# Patient Record
Sex: Male | Born: 1990 | Race: White | Hispanic: No | Marital: Single | Smoking: Never smoker
Health system: Southern US, Community
[De-identification: ages and names within clinical notes are randomized; demographics above are authoritative.]

## PROBLEM LIST (undated history)

## (undated) DIAGNOSIS — I10 Essential (primary) hypertension: Secondary | ICD-10-CM

## (undated) DIAGNOSIS — F909 Attention-deficit hyperactivity disorder, unspecified type: Secondary | ICD-10-CM

## (undated) DIAGNOSIS — F32A Depression, unspecified: Secondary | ICD-10-CM

## (undated) DIAGNOSIS — Z8249 Family history of ischemic heart disease and other diseases of the circulatory system: Secondary | ICD-10-CM

## (undated) DIAGNOSIS — F419 Anxiety disorder, unspecified: Secondary | ICD-10-CM

## (undated) HISTORY — PX: OTHER SURGICAL HISTORY: SHX169

## (undated) HISTORY — DX: Family history of ischemic heart disease and other diseases of the circulatory system: Z82.49

## (undated) HISTORY — DX: Essential (primary) hypertension: I10

---
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
## 2012-10-20 ENCOUNTER — Other Ambulatory Visit: Payer: Self-pay | Admitting: Cardiovascular Disease

## 2013-01-18 ENCOUNTER — Other Ambulatory Visit: Payer: Self-pay | Admitting: Cardiovascular Disease

## 2013-01-18 ENCOUNTER — Other Ambulatory Visit: Payer: Self-pay | Admitting: Internal Medicine

## 2013-03-22 ENCOUNTER — Other Ambulatory Visit: Payer: Self-pay | Admitting: Cardiovascular Disease

## 2020-02-14 ENCOUNTER — Emergency Department (HOSPITAL_COMMUNITY)
Admission: EM | Admit: 2020-02-14 | Discharge: 2020-02-14 | Disposition: A | Discharge status: Home / Self Care | Payer: 59 | Attending: Emergency Medicine | Admitting: Emergency Medicine

## 2020-02-14 ENCOUNTER — Other Ambulatory Visit: Payer: Self-pay

## 2020-02-14 ENCOUNTER — Ambulatory Visit (HOSPITAL_COMMUNITY)
Admission: EM | Admit: 2020-02-14 | Discharge: 2020-02-14 | Disposition: A | Discharge status: Home / Self Care | Payer: 59 | Attending: Registered Nurse | Admitting: Registered Nurse

## 2020-02-14 ENCOUNTER — Encounter (HOSPITAL_COMMUNITY): Payer: Self-pay | Admitting: Registered Nurse

## 2020-02-14 ENCOUNTER — Encounter (HOSPITAL_COMMUNITY): Payer: Self-pay

## 2020-02-14 DIAGNOSIS — R110 Nausea: Secondary | ICD-10-CM | POA: Diagnosis not present

## 2020-02-14 DIAGNOSIS — F411 Generalized anxiety disorder: Secondary | ICD-10-CM | POA: Diagnosis not present

## 2020-02-14 DIAGNOSIS — F410 Panic disorder [episodic paroxysmal anxiety] without agoraphobia: Secondary | ICD-10-CM | POA: Diagnosis present

## 2020-02-14 DIAGNOSIS — Z5321 Procedure and treatment not carried out due to patient leaving prior to being seen by health care provider: Secondary | ICD-10-CM | POA: Diagnosis not present

## 2020-02-14 DIAGNOSIS — F419 Anxiety disorder, unspecified: Secondary | ICD-10-CM | POA: Diagnosis not present

## 2020-02-14 DIAGNOSIS — R079 Chest pain, unspecified: Secondary | ICD-10-CM | POA: Insufficient documentation

## 2020-02-14 DIAGNOSIS — R0602 Shortness of breath: Secondary | ICD-10-CM | POA: Diagnosis not present

## 2020-02-14 DIAGNOSIS — Z638 Other specified problems related to primary support group: Secondary | ICD-10-CM | POA: Diagnosis not present

## 2020-02-14 HISTORY — DX: Depression, unspecified: F32.A

## 2020-02-14 HISTORY — DX: Anxiety disorder, unspecified: F41.9

## 2020-02-14 HISTORY — DX: Attention-deficit hyperactivity disorder, unspecified type: F90.9
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
## 2020-02-14 NOTE — ED Notes (Signed)
[address] 

## 2020-02-14 NOTE — ED Notes (Signed)
Discharge instructions reviewed, stated understanding. Clarification for meds made. Escorted to the lobby where Pt's mom was waiting. Personal belongings returned to Pt.

## 2020-02-14 NOTE — Discharge Instructions (Addendum)
Follow up with current psychiatrist in [place].  Call to see if will give a prescription for an as needed anxiety medication.  Once back in [place] follow in person
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
## 2021-10-10 ENCOUNTER — Other Ambulatory Visit (HOSPITAL_COMMUNITY): Payer: 59 | Attending: Psychiatry | Admitting: Psychiatry

## 2021-10-10 DIAGNOSIS — F319 Bipolar disorder, unspecified: Secondary | ICD-10-CM | POA: Insufficient documentation

## 2021-10-10 DIAGNOSIS — Z79899 Other long term (current) drug therapy: Secondary | ICD-10-CM | POA: Insufficient documentation

## 2021-10-10 DIAGNOSIS — F4323 Adjustment disorder with mixed anxiety and depressed mood: Secondary | ICD-10-CM | POA: Insufficient documentation

## 2021-10-10 NOTE — Progress Notes (Addendum)
Virtual Visit via Video Note  I connected with Jerry [name] on @TODAY @ at  9:30 AM EDT by a video enabled telemedicine application and verified that I am speaking with the correct person using two identifiers.  Location: Patient: at home Provider: at office   I discussed the limitations of evaluation and management by telemedicine and the availability of in person appointments. The patient expressed understanding and agreed to proceed.  I discussed the assessment and treatment plan with the patient. The patient was provided an opportunity to ask questions and all were answered. The patient agreed with the plan and demonstrated an understanding of the instructions.   The patient was advised to call back or seek an in-person evaluation if the symptoms worsen or if the condition fails to improve as anticipated.  I provided 90 minutes of non-face-to-face time during this encounter.   [name], M.Ed,CNA   Comprehensive Clinical Assessment (CCA) Note  10/10/2021 Jerry [name] [id number]  Chief Complaint:  Chief Complaint  Patient presents with   Anxiety   Depression   Adjustment Disorder   Visit Diagnosis: F 31.12    CCA Screening, Triage and Referral (STR)  Patient Reported Information How did you hear about [place]? Other (Comment)  Referral name: Family referred pt  Referral phone number: No data recorded  Whom do you see for routine medical problems? I don't have a doctor  Practice/Facility Name: No data recorded Practice/Facility Phone Number: No data recorded Name of Contact: No data recorded Contact Number: No data recorded Contact Fax Number: No data recorded Prescriber Name: No data recorded Prescriber Address (if known): No data recorded  What Is the Reason for Your Visit/Call Today? Was in [place]; had to leave the PHP there, was just stepped down to MH-IOP; but had to leave and come back to [place] d/t job.  How Long Has This Been Causing You Problems? > than 6  months  What Do You Feel Would Help You the Most Today? Stress Management; Treatment for Depression or other mood problem   Have You Recently Been in Any Inpatient Treatment (Hospital/Detox/Crisis Center/28-Day Program)? No  Name/Location of Program/Hospital:No data recorded How Long Were You There? No data recorded When Were You Discharged? No data recorded  Have You Ever Received Services From [facility] Before? Yes  Who Do You [facility] at [facility]? Oct. [facility]   Have You Recently Had Any Thoughts About Hurting Yourself? No  Are You Planning to Commit Suicide/Harm Yourself At This time? No   Have you Recently Had Thoughts About [place]? No  Explanation: No data recorded  Have You Used Any Alcohol or Drugs in the Past 24 Hours? Yes  How Long Ago Did You Use Drugs or Alcohol? No data recorded What Did You Use and How Much? THC; "a vape pen"   Do You Currently Have a Therapist/Psychiatrist? No  Name of Therapist/Psychiatrist: No data recorded  Have You Been Recently Discharged From Any Office Practice or Programs? Yes  Explanation of Discharge From Practice/Program: cc: above     CCA Screening Triage Referral Assessment Type of Contact: No data recorded Is this Initial or Reassessment? No data recorded Date Telepsych consult ordered in [facility]:  No data recorded Time Telepsych consult ordered in [facility]:  No data recorded  Patient Reported Information Reviewed? No data recorded Patient Left Without Being Seen? No data recorded Reason for Not Completing Assessment: No data recorded  Collateral Involvement: No data recorded  Does Patient Have a Court Appointed  Legal Guardian? No data recorded Name and Contact of Legal Guardian: No data recorded If Minor and Not Living with Parent(s), Who has Custody? No data recorded Is CPS involved or ever been involved? Never  Is APS involved or ever been involved? Never   Patient Determined To Be At Risk for Harm To Self  or Others Based on Review of Patient Reported Information or Presenting Complaint? No  Method: No data recorded Availability of Means: No data recorded Intent: No data recorded Notification Required: No data recorded Additional Information for Danger to Others Potential: No data recorded Additional Comments for Danger to Others Potential: No data recorded Are There Guns or Other Weapons in Your Home? No data recorded Types of Guns/Weapons: No data recorded Are These Weapons Safely Secured?                            No data recorded Who Could Verify You Are Able To Have These Secured: No data recorded Do You Have any Outstanding Charges, Pending Court Dates, Parole/Probation? No data recorded Contacted To Inform of Risk of Harm To Self or Others: No data recorded  Location of Assessment: Other (comment)   Does Patient Present under Involuntary Commitment? No  IVC Papers Initial File Date: No data recorded  [place] of Residence: [name]   Patient Currently Receiving the Following Services: No data recorded  Determination of Need: Routine (7 days)   Options For Referral: Intensive Outpatient Therapy     CCA Biopsychosocial Intake/Chief Complaint:  This is a 31 yr old, single, Caucasian male who was a self referral.  According to pt, he was recently attending a PHP at [facility] in [place] (07-23-21 thru 09-09-21; d/t his Bipolar episode.  States the program had just stepped him down to MH-IOP, but he received a call from his employer that he needed to return back to work.  Pt states his treatment was interrupted to come home for [facility], but they still are going to let him go on 10-23-21. Stressors:  1) Job:  Exploring job options.  2) Finances:  Pt states he is in a lot of debt.  "I constantly worry about my finances.  3) Housing:  Pt is temporarily residing with his mother.  He states they argue all the time.  Pt denies any previous psych admissions or suicide attempts or gestures.  Has a new  patient appt coming up with [facility] (psychiatrist on 10-23-21) and therapist on 11-28-21.  Also new pt appt with a PCP on 10-25-21.  Family hx:  Maternal Aunt (Bipolar); Mom (Depressed) and Sister (Anxiety).  Current Symptoms/Problems: poor concentration, irritability, anger, poor sleep, mood swings, sad, ruminating, racing thoughts, anxiety, anhedonia, restlessness, decreased appetite, crying spells, increased energy   Patient Reported Schizophrenia/Schizoaffective Diagnosis in Past: No   Strengths: "I am empathetic, I work really hard at what I do."  Preferences: "I need to work on being nice to people and to not lie.  I need to work on my finances.  I need to work on time management."  Abilities: No data recorded  Type of Services Patient Feels are Needed: MH-IOP   Initial Clinical Notes/Concerns: Patients medications doesn't seem to be working for him.  Pt appears to be in a manic episode still.   Mental Health Symptoms Depression:   Change in energy/activity; Difficulty Concentrating; Increase/decrease in appetite; Fatigue; Irritability; Sleep (too much or little); Tearfulness; Worthlessness   Duration of Depressive symptoms:  Greater than two weeks   Mania:   Change in energy/activity; Irritability; Racing thoughts; Increased Energy; Euphoria; Overconfidence   Anxiety:    Restlessness   Psychosis:   None   Duration of Psychotic symptoms: No data recorded  Trauma:   N/A   Obsessions:   N/A   Compulsions:   N/A   Inattention:   N/A   Hyperactivity/Impulsivity:   N/A   Oppositional/Defiant Behaviors:   N/A   Emotional Irregularity:   N/A   Other Mood/Personality Symptoms:  No data recorded   Mental Status Exam Appearance and self-care  Stature:   Average   Weight:   Overweight   Clothing:   Casual   Grooming:   Normal   Cosmetic use:   None   Posture/gait:   Normal   Motor activity:   Not Remarkable   Sensorium  Attention:    Normal   Concentration:   Variable   Orientation:   X5   Recall/memory:   Normal   Affect and Mood  Affect:   Appropriate   Mood:   Euphoric   Relating  Eye contact:   Normal   Facial expression:   Responsive   Attitude toward examiner:   Cooperative   Thought and Language  Speech flow:  Normal   Thought content:   Appropriate to Mood and Circumstances   Preoccupation:   None   Hallucinations:   None   Organization:  No data recorded  [employer] of Knowledge:   Average   Intelligence:   Average   Abstraction:   Normal   Judgement:   Impaired   Reality Testing:   Adequate   Insight:   Good   Decision Making:   Vacilates   Social Functioning  Social Maturity:   Impulsive   Social Judgement:   Normal   Stress  Stressors:   Museum/gallery curator; Work; Family conflict; Grief/losses; Housing; Illness; Legal; Relationship   Coping Ability:   Overwhelmed; Exhausted   Skill Deficits:   Decision making; Communication   Supports:   Family; Friends/Service system; Church     Religion: Religion/Spirituality Are You A Religious Person?: Yes What is Your Religious Affiliation?: Methodist  Leisure/Recreation: Leisure / Recreation Do You Have Hobbies?: Yes Leisure and Hobbies: shopping, decorating home, Artist, music, food  Exercise/Diet: Exercise/Diet Do You Exercise?: No Have You Gained or Lost A Significant Amount of Weight in the Past Six Months?: No Do You Follow a Special Diet?: No Do You Have Any Trouble Sleeping?: Yes Explanation of Sleeping Difficulties: "I don't want to go to sleep, b/c my dreams are too depressing."   CCA Employment/Education Employment/Work Situation: Employment / Work Situation Employment Situation: Employed Where is Patient Currently Employed?: States he will be unemployed on 10-23-21 from [place] has Patient Been Employed?: Since Aug. 2021 Are You Satisfied With Your Job?:  No Do You Work More Than One Job?: No Work Stressors: States they will be firing him on 10-23-21.  He said it's too much pressure.  He's a Electrical engineer. Patient's Job has Been Impacted by Current Illness: Yes Describe how Patient's Job has Been Impacted: Had to take a leave of absence d/t extreme panic attacks. What is the Longest Time Patient has Held a Job?: 4 1/2 yrs Where was the Patient Employed at that Time?: [place] in [place] Has Patient ever Been in the [employer]?: No  Education: Education Is Patient Currently Attending School?: No Did Teacher, adult education From [employer]?: Yes Did  You Attend College?: Yes What Type of College Degree Do you Have?: BS Did [place]?: No What Was Your Major?: Design Did You Have An Individualized Education Program (IIEP): No Did You Have Any Difficulty At School?: No Patient's Education Has Been Impacted by Current Illness: No   CCA Family/Childhood History Family and Relationship History: Family history Marital status: Single Are you sexually active?: Yes What is your sexual orientation?: "gay" Has your sexual activity been affected by drugs, alcohol, medication, or emotional stress?: n/a Does patient have children?: No  Childhood History:  Childhood History By whom was/is the patient raised?: Mother Additional childhood history information: Born in [place]; raised in [place], [place].  At age 78 pt's father passed; raised by mother.  Grew up with a single parent (mom).  Mother was very depressed and "kind of absent."  Was a Proofreader.  Denies any abuse. Patient's description of current relationship with people who raised him/her: Constantly agruing with mother. Does patient have siblings?: Yes Number of Siblings: 1 Description of patient's current relationship with siblings: Older sister lives in [place].  Pt is close "enough" to sister. Did patient suffer any verbal/emotional/physical/sexual abuse as a child?: No Did patient suffer from severe  childhood neglect?: No Has patient ever been sexually abused/assaulted/raped as an adolescent or adult?: No Was the patient ever a victim of a crime or a disaster?: No Witnessed domestic violence?: No Has patient been affected by domestic violence as an adult?: No  Child/Adolescent Assessment:     CCA Substance Use Alcohol/Drug Use: Alcohol / Drug Use Pain Medications: cc: MAR Prescriptions: Depakote 250mg  daily, Depakote ER 500 mg, Buspar 15 mg BID, Gabapentin 300 mg BID, Lexapro 20 mg daily, Atarx 50 mg hs, Melatonin 5 mg hs Over the Counter: cc: MAR History of alcohol / drug use?: Yes Substance #1 Name of Substance 1: ETOH 1 - Age of First Use: 20 1 - Amount (size/oz): Wine/Beer (3-5 drinks at a time)  "It varies" 1 - Frequency: 2-3 nights a week 1 - Duration: last yr and a half 1 - Last Use / Amount: 10-08-21 (two glasses of wine at dinner) 1 - Method of Aquiring: store 1- Route of Use: drink Substance #2 Name of Substance 2: THC 2 - Age of First Use: 27 2 - Amount (size/oz): ~ three bowls 2 - Frequency: daily 2 - Duration: 1 1/2 yrs 2 - Last Use / Amount: 10-09-21; "few puffs" 2 - Method of Aquiring: brought from [place] 2 - Route of Substance Use: smoke                     ASAM's:  Six Dimensions of Multidimensional Assessment  Dimension 1:  Acute Intoxication and/or Withdrawal Potential:      Dimension 2:  Biomedical Conditions and Complications:      Dimension 3:  Emotional, Behavioral, or Cognitive Conditions and Complications:     Dimension 4:  Readiness to Change:     Dimension 5:  Relapse, Continued use, or Continued Problem Potential:     Dimension 6:  Recovery/Living Environment:     ASAM Severity Score:    ASAM Recommended Level of Treatment:     Substance use Disorder (SUD)    Recommendations for Services/Supports/Treatments: Recommendations for Services/Supports/Treatments Recommendations For Services/Supports/Treatments: IOP (Intensive  Outpatient Program)  DSM5 Diagnoses: Patient Active Problem List   Diagnosis Date Noted   Panic attack 02/14/2020   GAD (generalized anxiety disorder) 02/14/2020   Family discord  02/14/2020   DYSPNEA 10/13/2009   ESSENTIAL HYPERTENSION, BENIGN 06/27/2009   HYPERTENSION, UNSPECIFIED 06/16/2009   CHEST PAIN-UNSPECIFIED 06/16/2009    Patient Centered Plan: Patient is on the following Treatment Plan(s):  Anxiety, Depression, and Impulse Control Oriented pt. Pt was advised of ROI must be obtained prior to any records release in order to collaborate his care with an outside provider.  Pt was advised if he has not already done so to contact the front desk to sign all necessary forms in order for MH-IOP to release info re: his care.  Consent:  Pt gives verbal consent for tx and assignment of benefits for services provided during this telehealth group process.  Pt expressed understanding and agreed to proceed. Collaboration of care:  Collaborate with Ricky Ala, NP AEB and Shade Flood, LCSW AEB.  Will collaborate with pt's new therapist for individual counseling and a psychiatrist for medication management after pt meets with them. Pt will improve his mood as evidenced by being happy again, managing his mood and coping with daily stressors for 5 out of 7 days for 60 days   Referrals to Alternative Service(s): Referred to Alternative Service(s):   Place:   Date:   Time:    Referred to Alternative Service(s):   Place:   Date:   Time:    Referred to Alternative Service(s):   Place:   Date:   Time:    Referred to Alternative Service(s):   Place:   Date:   Time:      @BHCOLLABOFCARE @  [place], RITA, M.Ed,CNA

## 2021-10-11 ENCOUNTER — Other Ambulatory Visit (HOSPITAL_COMMUNITY): Payer: 59 | Admitting: Psychiatry

## 2021-10-11 DIAGNOSIS — F4323 Adjustment disorder with mixed anxiety and depressed mood: Secondary | ICD-10-CM | POA: Diagnosis present

## 2021-10-11 DIAGNOSIS — Z79899 Other long term (current) drug therapy: Secondary | ICD-10-CM | POA: Diagnosis not present

## 2021-10-11 DIAGNOSIS — F319 Bipolar disorder, unspecified: Secondary | ICD-10-CM | POA: Diagnosis not present
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
## 2021-10-11 NOTE — Plan of Care (Signed)
Patient is an active participant in his treatment plan.

## 2021-10-12 ENCOUNTER — Other Ambulatory Visit (HOSPITAL_COMMUNITY): Payer: 59 | Admitting: Licensed Clinical Social Worker

## 2021-10-12 DIAGNOSIS — F4323 Adjustment disorder with mixed anxiety and depressed mood: Secondary | ICD-10-CM

## 2021-10-12 NOTE — Progress Notes (Signed)
Virtual Visit via Video Note   I connected with Jerry [name] on 10/12/21 at  9:00 AM EDT by a video enabled telemedicine application and verified that I am speaking with the correct person using two identifiers.   At orientation to the IOP program, Case Manager discussed the limitations of evaluation and management by telemedicine and the availability of in person appointments. The patient expressed understanding and agreed to proceed with virtual visits throughout the duration of the program.   Location:  Patient: [facility] Home Provider: [facility]   History of Present Illness: Adjustment Disorder with Mixed Anxiety and Depressed Mood   Observations/Objective: Check In: Case Manager checked in with all participants to review discharge dates, insurance authorizations, work-related documents and needs from the treatment team regarding medications. Jerry [name] stated needs and engaged in discussion.    Initial Therapeutic Activity: Counselor facilitated a check-in with Jerry [name] to assess for safety, sobriety and medication compliance.  Counselor also inquired about Jerry [name]'s current emotional ratings, as well as any significant changes in thoughts, feelings or behavior since previous check in.  Jerry [name] presented for session on time and was alert, oriented x5, with no evidence or self-report of active SI/HI or A/V H.  Jerry [name] reported compliance with medication and denied use of alcohol.  He reported that he is still using marijuana daily, but attempting to cut back, stating I want to wean myself off eventually".  Jerry [name] reported scores of 8/10 for depression, 9/10 for anxiety, and 5/10 for irritability.  Keylor denied any recent outbursts or panic attacks.  Jerry [name] reported that a recent struggle was feeling down after group yesterday, but a success was taking a nap, and then going to dinner with an old friend.  Jerry [name] reported that his goal this weekend is to work on his resume so he can begin putting in applications  again.       Second Therapeutic Activity:  Counselor offered to teach group members an ACT relaxation technique today to aid in managing difficult thoughts, feelings, urges, and sensations.  Counselor guided members through process of getting comfortable, achieving relaxing breathing rhythm, and then maintaining this throughout activity.  Counselor invited members to imagine a gently flowing stream in their mind with leaves floating upon it, and when any thoughts, feelings, urges, or sensations arose, good or bad, they were instructed to visualize placing them on these passing leaves over course of 10 minutes practice.  Intervention was ineffective, as evidenced by Jerry [name] attempting exercise, but reporting that he finds meditation style activities frustrating because its hard to focus.  Bear reported that he has tried similar activities in the past with little success, but remains motivated to keep an open mind and try other techniques during [facility] to improve coping skills.    Assessment and Plan: Counselor recommends that [place] remain in IOP treatment to better manage mental health symptoms, ensure stability and pursue completion of treatment plan goals. Counselor recommends adherence to crisis/safety plan, taking medications as prescribed, and following up with medical professionals if any issues arise.   Follow Up Instructions: Counselor will send Webex link for next session. Charon was advised to call back or seek an in-person evaluation if the symptoms worsen or if the condition fails to improve as anticipated.   Collaboration of Care:   Medication Management AEB Jerry Jacks, NP  Case Manager AEB Jerry Modena, CNA     Patient/Guardian was advised Release of Information must be obtained prior to any record release in order to collaborate their care with an outside provider. Patient/Guardian was advised if they have not already done so to contact the  registration department to sign all necessary forms in order for [place] to release information regarding their care.    Consent: Patient/Guardian gives verbal consent for treatment and assignment of benefits for services provided during this visit. Patient/Guardian expressed understanding and agreed to proceed.   I provided 180 minutes of non-face-to-face time during this encounter.   Jerry Stain, LCSW, LCAS 10/12/21

## 2021-10-15 ENCOUNTER — Other Ambulatory Visit (HOSPITAL_COMMUNITY): Payer: 59 | Admitting: Licensed Clinical Social Worker

## 2021-10-15 DIAGNOSIS — F4323 Adjustment disorder with mixed anxiety and depressed mood: Secondary | ICD-10-CM | POA: Diagnosis not present

## 2021-10-15 NOTE — Progress Notes (Signed)
Virtual Visit via Video Note   I connected with [name] on 10/15/21 at  9:00 AM EDT by a video enabled telemedicine application and verified that I am speaking with the correct person using two identifiers.   At orientation to the IOP program, Case Manager discussed the limitations of evaluation and management by telemedicine and the availability of in person appointments. The patient expressed understanding and agreed to proceed with virtual visits throughout the duration of the program.   Location:  Patient: [facility] Home [facility]: [facility]   History of Present Illness: Adjustment Disorder with mixed Anxiety and Depressed Mood    Observations/Objective: Check In: Case Manager checked in with all participants to review discharge dates, insurance authorizations, work-related documents and needs from the treatment team regarding medications. Kaan stated needs and engaged in discussion.    Initial Therapeutic Activity: Counselor facilitated a check-in with Phill to assess for safety, sobriety and medication compliance.  Counselor also inquired about Hesston's current emotional ratings, as well as any significant changes in thoughts, feelings or behavior since previous check in.  Violet presented for session on time and was alert, oriented x5, with no evidence or self-report of active SI/HI or A/V H.  Westly reported compliance with medication.  He reported that he did drink some wine and smoke marijuana over the weekend, although he denied abuse.  Socorro reported scores of 8/10 for depression, 5/10 for anxiety, and 10/10 for anger/irritability.  Bulmaro denied any recent panic attacks.  Elvin reported that a recent success was going out of town to visit a friend for her birthday, although this reminded him of how lonely he has been since moving.  Kadan reported that a recent struggle was having an outburst over the weekend after visiting his father's grave, stating "Me and mom my got into an  argument, but we talked it out".  Sherron reported that his goal today is to prepare for a trip out of town for a wedding this weekend.      Second Therapeutic Activity: Counselor introduced topic of self-care today.  Counselor explained how this can be defined as the things one does to maintain good health and improve well-being.  Counselor provided members with a self-care assessment form to complete.  This handout featured various sub-categories of self-care, including physical, psychological/emotional, social, spiritual, and professional.  Members were asked to rank their engagement in the activities listed for each dimension on a scale of 1-3, with 1 indicating 'Poor', 2 indicating 'Ok', and 3 indicating 'Well'.  Counselor invited members to share results of their assessment, and inquired about which areas of self-care they are doing well in, as well as areas that require attention, and how they plan to begin addressing this during treatment.  Intervention was effective, as evidenced by [name] successfully completing first section (physical) of assessment and actively engaging in discussion on subject, reporting that he is excelling in areas such as going to preventative medical appointments, wearing clothes that make him feel good about himself, and eating regularly, but would benefit from focusing more on areas such as eating healthy foods, taking care of personal hygiene, exercising, participating in fun activities, and getting enough sleep.  Iban reported that he would work to improve self-care deficits by implementing sleep hygiene techniques to improve rest, getting outside of the home to socialize more often, making healthier food choices, finding a new home gym and taking exercise classes when motivation returns.    Assessment and Plan: Counselor recommends that  [place] remain in IOP treatment to better manage mental health symptoms, ensure stability and pursue completion of treatment plan goals.  Counselor recommends adherence to crisis/safety plan, taking medications as prescribed, and following up with medical professionals if any issues arise.   Follow Up Instructions: Counselor will send Webex link for next session. Kellan was advised to call back or seek an in-person evaluation if the symptoms worsen or if the condition fails to improve as anticipated.   Collaboration of Care:   Medication Management AEB Hillery Jacks, NP                                           Case Manager AEB Jeri Modena, CNA     Patient/Guardian was advised Release of Information must be obtained prior to any record release in order to collaborate their care with an outside provider. Patient/Guardian was advised if they have not already done so to contact the registration department to sign all necessary forms in order for [place] to release information regarding their care.    Consent: Patient/Guardian gives verbal consent for treatment and assignment of benefits for services provided during this visit. Patient/Guardian expressed understanding and agreed to proceed.   I provided 180 minutes of non-face-to-face time during this encounter.   [name], LCSW, LCAS 10/15/21

## 2021-10-16 ENCOUNTER — Other Ambulatory Visit (HOSPITAL_COMMUNITY): Payer: 59 | Admitting: Licensed Clinical Social Worker

## 2021-10-16 DIAGNOSIS — F4323 Adjustment disorder with mixed anxiety and depressed mood: Secondary | ICD-10-CM

## 2021-10-16 NOTE — Progress Notes (Signed)
Virtual Visit via Video Note   I connected with Jerry [name] on 10/16/21 at  9:00 AM EDT by a video enabled telemedicine application and verified that I am speaking with the correct person using two identifiers.   At orientation to the IOP program, Case Manager discussed the limitations of evaluation and management by telemedicine and the availability of in person appointments. The patient expressed understanding and agreed to proceed with virtual visits throughout the duration of the program.   Location:  Patient: Patient Home Provider: [facility]   History of Present Illness: Adjustment Disorder with Mixed Anxiety and Depressed Mood    Observations/Objective: Check In: Case Manager checked in with all participants to review discharge dates, insurance authorizations, work-related documents and needs from the treatment team regarding medications. Jerry [name] stated needs and engaged in discussion.    Initial Therapeutic Activity: Counselor facilitated a check-in with Jerry [name] to assess for safety, sobriety and medication compliance.  Counselor also inquired about Jerry [name]'s current emotional ratings, as well as any significant changes in thoughts, feelings or behavior since previous check in.  Jerry [name] presented for session on time and was alert, oriented x5, with no evidence or self-report of active SI/HI or A/V H.  Jerry [name] [name] compliance with medication and denied use of alcohol or illicit substances.  Jerry [name] [name] scores of 6/10 for depression, 6/10 for anxiety, and 4/10 for anger/irritability.  Jerry [name] denied any recent outbursts or panic attacks.  Jerry [name] [name] that a recent success was having dinner, washing some dishes, and watching TV with his mother yesterday evening.  Jerry [name] [name] that a recent struggle was having to speak with his sister about the upcoming wedding, stating "We don't always see eye to eye".  Jerry [name] [name] that his goal today is to take a shower and brush his teeth, stating "I  want to do the bare minimum".        Second Therapeutic Activity: Counselor introduced Jerry [name], Jerry [name] to provide psychoeducation on topic of Grief and Loss with members today.  Jerry [name] began discussion by checking in with the group about their baseline mood today, general thoughts on what grief means to them and how it has affected them personally in the past.  Jerry [name] provided information on how the process of grief/loss can differ depending upon one's unique culture, and categories of loss one could experience (i.e. loss of a person, animal, relationship, job, identity, etc).  Jerry [name] encouraged members to be mindful of how pervasive loss can be, and how to recognize signs which could indicate that this is having an impact on one's overall mental health and wellbeing.  Intervention was effective, as evidenced by Jerry [name] participating in discussion with speaker on the subject, reporting that loss has had a profound effect on his life due to the early passing of his father when he was 56 years old.  Jerry [name] that as a result of this, he anticipates loss and tries to protect himself from being surprised when something goes wrong.  Jerry [name] that to cope with loss, he tries to distract himself by spending time with quality supports or engaging in self-care activities like listening to an Engineer, production.    Third Therapeutic Activity: Counselor discussed topic of grounding techniques with members to improve coping ability.  Counselor invited members to practice the 5-4-3-2-1 grounding technique today in session to help temporarily distract them from troubling thoughts and/or feelings when needed.  Counselor guided members through process of scanning their environment and identifying 5  things they could see, 4 things they could touch, 3 things they could hear, 2 they could smell, and 1 thing they could taste.  Intervention was effective, as evidenced by Jerry [name] participating in activity  successfully and reporting that this exercise helped him identify calming scents which could aid in relaxation when feeling distressed, such as the smell of honey suckle since this reminds him of his childhood home.     Assessment and Plan: Counselor recommends that [name] remain in IOP treatment to better manage mental health symptoms, ensure stability and pursue completion of treatment plan goals. Counselor recommends adherence to crisis/safety plan, taking medications as prescribed, and following up with medical professionals if any issues arise.   Follow Up Instructions: Counselor will send Webex link for next session. [name] was advised to call back or seek an in-person evaluation if the symptoms worsen or if the condition fails to improve as anticipated.   Collaboration of Care:   Medication Management AEB Hillery Jacks, NP                                           Case Manager AEB Jeri Modena, CNA     Patient/Guardian was advised Release of Information must be obtained prior to any record release in order to collaborate their care with an outside provider. Patient/Guardian was advised if they have not already done so to contact the registration department to sign all necessary forms in order for [place] to release information regarding their care.    Consent: Patient/Guardian gives verbal consent for treatment and assignment of benefits for services provided during this visit. Patient/Guardian expressed understanding and agreed to proceed.   I provided 180 minutes of non-face-to-face time during this encounter.   [name], LCSW, LCAS 10/16/21

## 2021-10-17 ENCOUNTER — Encounter (HOSPITAL_COMMUNITY): Payer: Self-pay | Admitting: Family

## 2021-10-17 ENCOUNTER — Other Ambulatory Visit (HOSPITAL_COMMUNITY): Payer: 59 | Admitting: Family

## 2021-10-17 DIAGNOSIS — F4323 Adjustment disorder with mixed anxiety and depressed mood: Secondary | ICD-10-CM

## 2021-10-17 NOTE — Progress Notes (Signed)
Virtual Visit via Video Note   I connected with Jerry [name] on 10/17/21 at  9:00 AM EDT by a video enabled telemedicine application and verified that I am speaking with the correct person using two identifiers.   At orientation to the IOP program, Case Manager discussed the limitations of evaluation and management by telemedicine and the availability of in person appointments. The patient expressed understanding and agreed to proceed with virtual visits throughout the duration of the program.   Location:  Patient: [facility] Home Provider: [facility]   History of Present Illness: Adjustment Disorder with Mixed Anxiety and Depressed Mood    Observations/Objective: Check In: Case Manager checked in with all participants to review discharge dates, insurance authorizations, work-related documents and needs from the treatment team regarding medications. Jerry [name] stated needs and engaged in discussion.    Initial Therapeutic Activity: Counselor facilitated a check-in with Jerry [name] to assess for safety, sobriety and medication compliance.  Counselor also inquired about Jerry [name]'s current emotional ratings, as well as any significant changes in thoughts, feelings or behavior since previous check in.  Jerry [name] presented for session on time and was alert, oriented x5, with no evidence or self-report of active SI/HI or A/V H.  Jerry [name] reported compliance with medication and denied use of alcohol.  Jerry [name] disclosed that he did use THC yesterday.  Jerry [name] reported scores of 5/10 for depression, 4/10 for anxiety, and 0/10 for anger/irritability.  Jerry [name] denied any recent outbursts or panic attacks.  Jerry [name] reported that a recent success was preventing himself from getting too upset about his old job.  Jerry [name] reported that a recent struggle was having an argument with his mother yesterday, stating "It wasn't as bad as it could have been, since we are trying to avoid fighting".  Jerry [name] reported that his goal today is to do some work on  his computer for 2 hours, stating "I need to get some photos and work off my portfolio".     Second Therapeutic Activity: Counselor introduced Jerry [name], Jerry [name], to provide psychoeducation on topic of nutrition with members today.  Jerry [name] virtually shared a comprehensive PowerPoint presentation to guide discussion, which featured the various components of healthy living that influence one's wellbeing, including practicing mindfulness, staying physically active, calm in mood, well-rested, and more.  Jerry [name] explained how good nutrition reinforces positive physical and mental health, and shared a video which explained how food intake affects brain functioning in particular.  Jerry [name] provided advice on how to adjust diet in order to promote wellbeing during course of treatment, including achieving balanced daily intake along with regular exercise.  Jerry [name] offered the 'plate method' as a tool for proper distribution of protein, grains, starches, vegetables, fruit, and low calorie drink, in addition to concept of 'mindful eating', and covering current Dietary Guidelines for Americans for more tips.  Jerry [name] inquired about changes members would like to make to their nutrition in order to increase overall wellbeing based upon information shared today, and time was allowed to ask any questions they might have of Jerry [name] regarding her specialty.  Intervention was effective, as evidenced by Jerry [name] participating in discussion with speaker on the subject, reporting that he used to be more active and maintain personal hygiene, but onset of depression greatly impeded his motivation to maintain good daily habits.  Jerry [name] also reported that addressing his diet would be beneficial during treatment, stating "I'm trying to change my relationship with food.  It got really bad over the past year and I'll either binge  eat way more than I need to, or barely eat at all".  Jerry [name] was receptive to feedback from nutritionist on how to begin  addressing current dietary issues, and improve day to day hygiene.  Jerry [name] stated "This showed me that a varied diet is important because there are a lot of nutrients the brain requires to be healthy".  Jerry [name] also participated in a voluntary stretching exercise in order to improve physical activity today.       Assessment and Plan: Counselor recommends that Jerry [name] remain in IOP treatment to better manage mental health symptoms, ensure stability and pursue completion of treatment plan goals. Counselor recommends adherence to crisis/safety plan, taking medications as prescribed, and following up with medical professionals if any issues arise.   Follow Up Instructions: Counselor will send Webex link for next session. Jerry [name] was advised to call back or seek an in-person evaluation if the symptoms worsen or if the condition fails to improve as anticipated.   Collaboration of Care:   Medication Management AEB Jerry Jacks, NP                                           Case Manager AEB Jerry Modena, CNA     Patient/Guardian was advised Release of Information must be obtained prior to any record release in order to collaborate their care with an outside provider. Patient/Guardian was advised if they have not already done so to contact the registration department to sign all necessary forms in order for [place] to release information regarding their care.    Consent: Patient/Guardian gives verbal consent for treatment and assignment of benefits for services provided during this visit. Patient/Guardian expressed understanding and agreed to proceed.   I provided 180 minutes of non-face-to-face time during this encounter.   Jerry Stain, LCSW, LCAS 10/17/21

## 2021-10-17 NOTE — Progress Notes (Cosign Needed Addendum)
Virtual Visit via Video Note  I connected with Jerry [name] on 10/17/21 at  9:00 AM EDT by a video enabled telemedicine application and verified that I am speaking with the correct person using two identifiers.  Location: Patient: Home Provider: Office   I discussed the limitations of evaluation and management by telemedicine and the availability of in person appointments. The patient expressed understanding and agreed to proceed.   I discussed the assessment and treatment plan with the patient. The patient was provided an opportunity to ask questions and all were answered. The patient agreed with the plan and demonstrated an understanding of the instructions.   The patient was advised to call back or seek an in-person evaluation if the symptoms worsen or if the condition fails to improve as anticipated.  I provided 15 minutes of non-face-to-face time during this encounter.   Oneta Rack, NP    Psychiatric Initial Adult Assessment   Patient Identification: Jerry [name] MRN:  [id number] Date of Evaluation:  10/17/2021 Referral Source: Partial hospitalization programming in [place] Chief Complaint: "I am feeling miserable"  Visit Diagnosis:    ICD-10-CM   1. Adjustment disorder with mixed anxiety and depressed mood  F43.23       History of Present Illness:  Jerry [name] is a 31 year old Caucasian male that was referred by previous partial hospitalization program in [place].  He reports a history with bipolar disorder, major depressive disorder and anxiety/panic disorder.  Jerry [name] reports he is currently prescribed Depakote, Lexapro, gabapentin, BuSpar and hydroxyzine.  States he has previously taken Lamictal, Seroquel and Topamax in the past without success.  States due to his heart condition he has trouble starting and stopping new medications.  He reports a follow-up appointment with a new psychiatrist at [facility] for medication management early next  week.  Jerry [name] reports experiencing frequent and worsening panic/anxiety attacks while in [place] where he was residing due to employment.  States after he suffered the worst panic attack he is ever experienced he was laid off from his job and forced to move back to [place].  He reports daily use of marijuana.  States occasional use with alcohol every few days.  He reports feeling measurable.  Denying suicidal or homicidal ideations.  Denies auditory or visual hallucinations.  Jerry [name] reports history with chronical physical pain.  States he is prescribed lorazepam and currently has 15 tablets available.  Denied previous inpatient admissions.  Patient to start intensive outpatient programming on 10/16/2021  Associated Signs/Symptoms: Depression Symptoms:  depressed mood, feelings of worthlessness/guilt, difficulty concentrating, anxiety, (Hypo) Manic Symptoms:  Distractibility, Impulsivity, Irritable Mood, Anxiety Symptoms:  Excessive Worry, Psychotic Symptoms:  Hallucinations: None PTSD Symptoms: NA  Past Psychiatric History: Reports that he has been followed by therapy and psychiatry in the past.  Is seeking to establish care with [facility] for medication management.  Denied previous suicide attempts and/or inpatient admissions.  Previous Psychotropic Medications: Yes   Substance Abuse History in the last 12 months:  Yes.    Consequences of Substance Abuse: NA  Past Medical History:  Past Medical History:  Diagnosis Date   ADHD    Anxiety    Depression    HTN (hypertension)     Past Surgical History:  Procedure Laterality Date   Wisdom teeth removal      Family Psychiatric History:   Family History:  Family History  Problem Relation Age of Onset   Heart attack Father 71    Social History:  Social History   Socioeconomic History   Marital status: Single    Spouse name: Not on file   Number of children: Not on file   Years of education: Not  on file   Highest education level: Not on file  Occupational History   Occupation: Single  Tobacco Use   Smoking status: Every Day   Smokeless tobacco: Current  Substance and Sexual Activity   Alcohol use: Yes   Drug use: Yes    Types: Cocaine, Marijuana   Sexual activity: Not on file  Other Topics Concern   Not on file  Social History Narrative   Not on file   Social Determinants of Health   Financial Resource Strain: Not on file  Food Insecurity: Not on file  Transportation Needs: Not on file  Physical Activity: Not on file  Stress: Not on file  Social Connections: Not on file    Additional Social History:   Allergies:  No Known Allergies  Metabolic Disorder Labs: No results found for: "HGBA1C", "MPG" No results found for: "PROLACTIN" No results found for: "CHOL", "TRIG", "HDL", "CHOLHDL", "VLDL", "LDLCALC" No results found for: "TSH"  Therapeutic Level Labs: No results found for: "LITHIUM" No results found for: "CBMZ" No results found for: "VALPROATE"  Current Medications: Current Outpatient Medications  Medication Sig Dispense Refill   amLODipine (NORVASC) 10 MG tablet TAKE 1 TABLET BY MOUTH EVERY DAY **NEEDS OV** 60 tablet 0   spironolactone (ALDACTONE) 25 MG tablet Take one tablet by mouth one time daily 90 tablet 2   No current facility-administered medications for this visit.    Musculoskeletal:   Psychiatric Specialty Exam: Review of Systems  HENT: Negative.    Eyes: Negative.   Cardiovascular: Negative.   Genitourinary: Negative.   Psychiatric/Behavioral:  Positive for agitation and decreased concentration. The patient is nervous/anxious.   All other systems reviewed and are negative.   There were no vitals taken for this visit.There is no height or weight on file to calculate BMI.  General Appearance: Casual  Eye Contact:  Good  Speech:  Clear and Coherent  Volume:  Normal  Mood:  Anxious and Depressed  Affect:  Congruent  Thought  Process:  Coherent  Orientation:  Full (Time, Place, and Person)  Thought Content:  Logical  Suicidal Thoughts:  No  Homicidal Thoughts:  No  Memory:  Immediate;   Good  Judgement:  Good  Insight:  Fair  Psychomotor Activity:  Normal  Concentration:  Concentration: Good  Recall:  Good  Fund of Knowledge:Good  Language: Fair  Akathisia:  No  Handed:  Right  AIMS (if indicated):  done  Assets:  Communication Skills Desire for Improvement Resilience Social Support  ADL's:  Intact  Cognition: WNL  Sleep:  Good   Screenings: PHQ2-9    Flowsheet Row Counselor from 10/10/2021 in [facility]  PHQ-2 Total Score 3  PHQ-9 Total Score 22      Flowsheet Row Counselor from 10/10/2021 in BE[facility]  C-SSRS RISK CATEGORY Error: Question 6 not populated       Assessment and Plan:  Start intensive outpatient programming Continue medications as indicated  Collaboration of Care: Medication Management AEB keep follow-up appointment with [facility], will make hydroxyzine available for reported increased anxiety.  Patient/Guardian was advised Release of Information must be obtained prior to any record release in order to collaborate their care with an outside provider. Patient/Guardian was advised if they have not already done so to  contact the registration department to sign all necessary forms in order for [place] to release information regarding their care.   Consent: Patient/Guardian gives verbal consent for treatment and assignment of benefits for services provided during this visit. Patient/Guardian expressed understanding and agreed to proceed.   Oneta Rack, NP 6/21/20232:32 PM

## 2021-10-18 ENCOUNTER — Other Ambulatory Visit (HOSPITAL_COMMUNITY): Payer: 59 | Admitting: Licensed Clinical Social Worker

## 2021-10-18 DIAGNOSIS — F4323 Adjustment disorder with mixed anxiety and depressed mood: Secondary | ICD-10-CM

## 2021-10-18 NOTE — Progress Notes (Signed)
Virtual Visit via Video Note   I connected with Jerry Jerry [name] on 10/18/21 at  9:00 AM EDT by a video enabled telemedicine application and verified that I am speaking with the correct person using two identifiers.   At orientation to the IOP program, Case Manager discussed the limitations of evaluation and management by telemedicine and the availability of in person appointments. The patient expressed understanding and agreed to proceed with virtual visits throughout the duration of the program.   Location:  Patient: Patient Home Provider: [facility]   History of Present Illness: Adjustment Disorder with Mixed Anxiety and Depressed Mood    Observations/Objective: Check In: Case Manager checked in with all participants to review discharge dates, insurance authorizations, work-related documents and needs from the treatment team regarding medications. Jerry Jerry [name] stated needs and engaged in discussion.    Initial Therapeutic Activity: Counselor facilitated a check-in with Jerry Jerry [name] to assess for safety, sobriety and medication compliance.  Counselor also inquired about Jerry Jerry [name]'s current emotional ratings, as well as any significant changes in thoughts, feelings or behavior since previous check in.  Jerry Jerry [name] presented for session on time and was alert, oriented x5, with no evidence or self-report of active SI/HI or A/V H.  Jerry Jerry [name] reported compliance with medication and denied use of alcohol or illicit substances.  Jerry Jerry [name] reported scores of 4/10 for depression, 3/10 for anxiety, and 5/10 for anger/irritability.  Jerry Jerry [name] denied any recent panic attacks.  Jerry Jerry [name] reported that a recent success was improving physical self-care yesterday by eating lunch, brushing, showering, and going to bed at a reasonable hour.  Jerry Jerry [name] reported that a recent struggle was becoming emotional after Jerry [name] yesterday, although he contacted a friend for support and felt better afterward.  Jerry Jerry [name] reported that his goal today is to investigate  unemployment benefits through [employer] to address financial concerns.      Second Therapeutic Activity: Counselor introduced topic of grounding skills today.  Counselor defined these as simple strategies one can use to help detach from difficult thoughts or feelings temporarily by focusing on something else.  Counselor noted that grounding will not solve the problem at hand, but can provide the practitioner with time to regain control over their thoughts and/or feelings and prevent the situation from getting worse (i.e. interrupting a panic attack).  Counselor divided these into three categories (mental, physical, and soothing) and then provided examples of each which Jerry [name] members could practice during session.  Some of these included describing one's environment in detail or playing a categories game with oneself for mental category, taking a hot bath/shower, stretching, or carrying a grounding object for physical category, and saying kind statements, or visualizing people one cares about for soothing category.  Counselor inquired about which techniques members have used with success in the past, or will commit to learning, practicing, and applying now to improve coping abilities.  Intervention was effective, as evidenced by Jerry Jerry [name] participating in discussion on the subject, trying out several of the techniques during session, and expressing interest in adding several to his available coping skills, such as playing a categories game involving listing male singers or fashion companies, visualizing being at a relaxing resort at the [name], watching a comedy special on TV that can make him laugh, or drinking cold water and focusing on the sensation/temperature.  Jerry Jerry [name] reported that this opened his mind to new approaches in handling panic attacks symptoms, and he would practice them to develop healthier habits.    Assessment and Plan: Counselor recommends that Jerry Jerry [name] remain in  IOP treatment to better manage mental  health symptoms, ensure stability and pursue completion of treatment plan goals. Counselor recommends adherence to crisis/safety plan, taking medications as prescribed, and following up with medical professionals if any issues arise.   Follow Up Instructions: Counselor will send Webex link for next session. Jerry Jerry [name] was advised to call back or seek an in-person evaluation if the symptoms worsen or if the condition fails to improve as anticipated.   Collaboration of Care:   Medication Management AEB Hillery Jacks, NP                                           Case Manager AEB Jeri Modena, CNA     Patient/Guardian was advised Release of Information must be obtained prior to any record release in order to collaborate their care with an outside provider. Patient/Guardian was advised if they have not already done so to contact the registration department to sign all necessary forms in order for [place] to release information regarding their care.    Consent: Patient/Guardian gives verbal consent for treatment and assignment of benefits for services provided during this visit. Patient/Guardian expressed understanding and agreed to proceed.   I provided 180 minutes of non-face-to-face time during this encounter.   [name], LCSW, LCAS 10/18/21

## 2021-10-19 ENCOUNTER — Ambulatory Visit (HOSPITAL_COMMUNITY): Payer: 59

## 2021-10-22 ENCOUNTER — Ambulatory Visit (HOSPITAL_COMMUNITY): Payer: 59

## 2021-10-23 ENCOUNTER — Other Ambulatory Visit (HOSPITAL_COMMUNITY): Payer: 59 | Admitting: Licensed Clinical Social Worker

## 2021-10-23 DIAGNOSIS — F4323 Adjustment disorder with mixed anxiety and depressed mood: Secondary | ICD-10-CM

## 2021-10-24 ENCOUNTER — Other Ambulatory Visit (HOSPITAL_COMMUNITY): Payer: 59 | Admitting: Licensed Clinical Social Worker

## 2021-10-24 DIAGNOSIS — F4323 Adjustment disorder with mixed anxiety and depressed mood: Secondary | ICD-10-CM

## 2021-10-24 NOTE — Progress Notes (Signed)
Virtual Visit via Video Note   I connected with [name] on 10/24/21 at  9:00 AM EDT by a video enabled telemedicine application and verified that I am speaking with the correct person using two identifiers.   At orientation to the IOP program, Case Manager discussed the limitations of evaluation and management by telemedicine and the availability of in person appointments. The patient expressed understanding and agreed to proceed with virtual visits throughout the duration of the program.   Location:  Patient: Patient Home Provider: [facility]   History of Present Illness: Adjustment Disorder with Mixed Anxiety and Depressed Mood    Observations/Objective: Check In: Case Manager checked in with all participants to review discharge dates, insurance authorizations, work-related documents and needs from the treatment team regarding medications. Montray stated needs and engaged in discussion.    Initial Therapeutic Activity: Counselor facilitated a check-in with Johnthan to assess for safety, sobriety and medication compliance.  Counselor also inquired about Ailton's current emotional ratings, as well as any significant changes in thoughts, feelings or behavior since previous check in.  Elzia presented for session on time and was alert, oriented x5, with no evidence or self-report of active SI/HI or A/V H.  Amron reported compliance with medication and denied use of alcohol or illicit substances.  Haskel reported scores of 4/10 for depression, 3/10 for anxiety, and 2/10 for anger/irritability.  Burrel denied any recent outbursts or panic attacks.  Glenroy reported that a recent success was visiting his new psychiatrist yesterday, stating "I feel pretty secure about my medication".  Amado reported that he also spoke with his mother yesterday afternoon about the trip to [place] and decided he will go after all.  Carmen reported that a recent struggle was driving through a certain part of town, which brought up  "Unwanted, repressed memories".  Tredarius reported that his goal today is to get his blood drawn to do a test for his doctor, do some laundry, and work on planning for his upcoming trip.      Second Therapeutic Activity: Counselor introduced Peggye Fothergill, [employer] Pharmacist, to provide psychoeducation on topic of medication compliance with members today.  Michelle Nasuti provided psychoeducation on classes of medications such as antidepressants, antipsychotics, what symptoms they are intended to treat, and any side effects one might encounter while on a particular prescription.  Time was allowed for clients to ask any questions they might have of [facility] regarding this specialty.  Intervention was effective, as evidenced by [name] participating in discussion with speaker on the subject, reporting that he has tried several different medications over the years with little change, and has become overwhelmed and disenchanted by lack of progress in addressing symptoms.  Fields was receptive to feedback from pharmacist on alternative options which may prove useful if explored with his psychiatrist, and encouraged him not to lose hope regarding finding a successful regimen with time.     Assessment and Plan: Counselor recommends that [place] remain in IOP treatment to better manage mental health symptoms, ensure stability and pursue completion of treatment plan goals. Counselor recommends adherence to crisis/safety plan, taking medications as prescribed, and following up with medical professionals if any issues arise.   Follow Up Instructions: Counselor will send Webex link for next session. Rease was advised to call back or seek an in-person evaluation if the symptoms worsen or if the condition fails to improve as anticipated.   Collaboration of Care:   Medication Management AEB Hillery Jacks, NP  Case Manager AEB Jeri Modena, CNA     Patient/Guardian was advised Release of Information must  be obtained prior to any record release in order to collaborate their care with an outside provider. Patient/Guardian was advised if they have not already done so to contact the registration department to sign all necessary forms in order for [place] to release information regarding their care.    Consent: Patient/Guardian gives verbal consent for treatment and assignment of benefits for services provided during this visit. Patient/Guardian expressed understanding and agreed to proceed.   I provided 180 minutes of non-face-to-face time during this encounter.   [name], LCSW, LCAS 10/24/21

## 2021-10-25 ENCOUNTER — Other Ambulatory Visit (HOSPITAL_COMMUNITY): Payer: 59 | Admitting: Psychiatry

## 2021-10-25 DIAGNOSIS — F4323 Adjustment disorder with mixed anxiety and depressed mood: Secondary | ICD-10-CM

## 2021-10-25 NOTE — Patient Instructions (Signed)
D:  Patient will complete MH-IOP successfully tomorrow (10-26-21).  A:  Follow up with Everett Graff, NP in one month and Loistine Chance (therapist) in August 2023.  Both providers are at [facility] [phone].  Strongly recommend support groups through [facility] [phone].  R:  Patient receptive.

## 2021-10-25 NOTE — Progress Notes (Signed)
Virtual Visit via Video Note   I connected with [name] on 10/25/21 at  9:00 AM EDT by a video enabled telemedicine application and verified that I am speaking with the correct person using two identifiers.   At orientation to the IOP program, Case Manager discussed the limitations of evaluation and management by telemedicine and the availability of in person appointments. The patient expressed understanding and agreed to proceed with virtual visits throughout the duration of the program.   Location:  Patient: Patient Home Provider: [facility]   History of Present Illness: Adjustment Disorder with Mixed Anxiety and Depressed Mood    Observations/Objective: Check In: Case Manager checked in with all participants to review discharge dates, insurance authorizations, work-related documents and needs from the treatment team regarding medications. Marquee stated needs and engaged in discussion.    Initial Therapeutic Activity: Counselor facilitated a check-in with Jasai to assess for safety, sobriety and medication compliance.  Counselor also inquired about Aimar's current emotional ratings, as well as any significant changes in thoughts, feelings or behavior since previous check in.  Yardley presented for session on time and was alert, oriented x5, with no evidence or self-report of active SI/HI or A/V H.  Amay reported compliance with medication and denied use of alcohol or illicit substances.  Liem reported scores of 2/10 for depression, 2/10 for anxiety, and 0/10 for anger/irritability.  Fermin denied any recent outbursts or panic attacks.  Thoams reported that a recent success was attending a blood draw appointment, and then treating himself to a nice meal, stating "I was proud of myself for getting some things done".  Locklan reported that he has also spent time updating his resume.  Codee reported that a recent struggle has been feeling like he had to conceal his identity again after moving back  to [place].  He was receptive to feedback from counselor on local resources for [facility] population.  Klye reported that his goal today is to look into the [employer] for [facility] individual, stating "I want to become more involved in the community".      Second Therapeutic Activity: Counselor introduced topic of self-esteem today and defined this as the value an individual places on oneself, based upon assessment of personal worth as a human being and approval/disapproval of one's behavior. Counselor asked members to assess their level of self-esteem at this time based upon common indicators of high self-esteem, including: accepting oneself unconditionally;  having self-respect and deep seated belief that one matters; being unaffected by other people's opinions/criticisms; and showing good control over emotions.  Counselor also explained concept of one's inner critic which serves to highlight faults and minimize strengths, directly influencing low sense of self-esteem.  Counselor then provided handout on 'strengths and qualities', which featured questions to guide discussion and increase awareness of each member's unique individual abilities which could reinforce higher self-esteem. Examples of questions included: 'things I am good at', 'challenges I have overcome', and 'what I like about myself'.  Intervention was effective, as evidenced by [name] actively engaging in discussion on topic, and completing a self-esteem assessment, receiving a score of 17, which indicated a 'moderate' level of self-esteem at this time due to traits such as putting down his personal abilities and skills, as well as relying on substances such as alcohol and THC to address difficult feelings.  Adarryl was receptive to several strategies offered today for increasing self-esteem during treatment, including setting aside more time for self-care while in-between jobs,  relying upon his support network for  positive feedback and encouragement, and recognizing challenges he has overcome in life to increase sense of resilience.  Hobson was also able to identify several personal strengths, such as artistic ability, leadership, enthusiasm, and love.    Assessment and Plan: Counselor recommends that [place] remain in IOP treatment to better manage mental health symptoms, ensure stability and pursue completion of treatment plan goals. Counselor recommends adherence to crisis/safety plan, taking medications as prescribed, and following up with medical professionals if any issues arise.   Follow Up Instructions: Counselor will send Webex link for next session. Zayvian was advised to call back or seek an in-person evaluation if the symptoms worsen or if the condition fails to improve as anticipated.   Collaboration of Care:   Medication Management AEB Hillery Jacks, NP                                           Case Manager AEB Jeri Modena, CNA     Patient/Guardian was advised Release of Information must be obtained prior to any record release in order to collaborate their care with an outside provider. Patient/Guardian was advised if they have not already done so to contact the registration department to sign all necessary forms in order for [place] to release information regarding their care.    Consent: Patient/Guardian gives verbal consent for treatment and assignment of benefits for services provided during this visit. Patient/Guardian expressed understanding and agreed to proceed.   I provided 180 minutes of non-face-to-face time during this encounter.   [name], LCSW, LCAS 10/25/21

## 2021-10-26 ENCOUNTER — Other Ambulatory Visit (HOSPITAL_COMMUNITY): Payer: 59 | Admitting: Psychiatry

## 2021-10-26 ENCOUNTER — Encounter (HOSPITAL_COMMUNITY): Payer: Self-pay | Admitting: Family

## 2021-10-26 DIAGNOSIS — F4323 Adjustment disorder with mixed anxiety and depressed mood: Secondary | ICD-10-CM | POA: Diagnosis not present

## 2021-10-26 NOTE — Progress Notes (Signed)
Virtual Visit via Telephone Note  I connected with [name] on 10/26/21 at  9:00 AM EDT by telephone and verified that I am speaking with the correct person using two identifiers.  Location: Patient: Home  Provider: Office    I discussed the limitations, risks, security and privacy concerns of performing an evaluation and management service by telephone and the availability of in person appointments. I also discussed with the patient that there may be a patient responsible charge related to this service. The patient expressed understanding and agreed to proceed.    I discussed the assessment and treatment plan with the patient. The patient was provided an opportunity to ask questions and all were answered. The patient agreed with the plan and demonstrated an understanding of the instructions.   The patient was advised to call back or seek an in-person evaluation if the symptoms worsen or if the condition fails to improve as anticipated.  I provided 15 minutes of non-face-to-face time during this encounter.   Oneta Rack, NP   [facility] Program Discharge Summary  Jezreel Justiniano [id number]  Admission date: 10/17/2021 Discharge date: 10/26/2021  Reason for admission: Asah Lamay is a 31 year old Caucasian male that was referred by previous partial hospitalization program in [place].  He reports a history with bipolar disorder, major depressive disorder and anxiety/panic disorder.Taevin reports he is currently prescribed Depakote, Lexapro, gabapentin, BuSpar and hydroxyzine.  States he has previously taken Lamictal, Seroquel and Topamax in the past without success.  States due to his heart condition he has trouble starting and stopping new medications.  He reports a follow-up appointment with a new psychiatrist at [facility] for medication management early next week.   Progress in Program Toward Treatment Goals: Progressing,  patient attended and participated with daily group sessions with active and engaged participation.  Denying suicidal or homicidal ideations at discharge.  Reports he has a follow-up appointment in 1 month with his psychiatric provider.  Does report sleep could be better however overall his mood is improved and stabilized.  No medication refills noted at discharge.  Patient to keep all follow-up outpatient appointments.  Support encouragement reassurance was provided.  Progress (rationale): Keep follow-up appointment with [facility] Luciana Axe August/2023  Take all medications as prescribed. Keep all follow-up appointments as scheduled.  Do not consume alcohol or use illegal drugs while on prescription medications. Report any adverse effects from your medications to your primary care provider promptly.  In the event of recurrent symptoms or worsening symptoms, call 911, a crisis hotline, or go to the nearest emergency department for evaluation.    Collaboration of Care: Medication Management AEB Milta Deiters  and Psychiatrist AEB [facility].   Patient/Guardian was advised Release of Information must be obtained prior to any record release in order to collaborate their care with an outside provider. Patient/Guardian was advised if they have not already done so to contact the registration department to sign all necessary forms in order for [place] to release information regarding their care.   Consent: Patient/Guardian gives verbal consent for treatment and assignment of benefits for services provided during this visit. Patient/Guardian expressed understanding and agreed to proceed.   Oneta Rack, NP 10/26/2021

## 2021-10-26 NOTE — Progress Notes (Signed)
Virtual Visit via Video Note  I connected with Jerry [name] on @TODAY @ at  9:00 AM EDT by a video enabled telemedicine application and verified that I am speaking with the correct person using two identifiers.  Location: Patient: at home Provider: at office   I discussed the limitations of evaluation and management by telemedicine and the availability of in person appointments. The patient expressed understanding and agreed to proceed.    I discussed the assessment and treatment plan with the patient. The patient was provided an opportunity to ask questions and all were answered. The patient agreed with the plan and demonstrated an understanding of the instructions.   The patient was advised to call back or seek an in-person evaluation if the symptoms worsen or if the condition fails to improve as anticipated.  I provided 20 minutes of non-face-to-face time during this encounter.   , M.Ed,CNA   Patient ID: Jerry [name], male   DOB: [birth year], 31 y.o.   MRN: [id number] As per previous CCA states:  This is a 31 yr old, single, Caucasian male who was a self referral.  According to pt, he was recently attending a PHP at [facility] in [place] (07-23-21 thru 09-09-21; d/t his Bipolar episode.  States the program had just stepped him down to MH-IOP, but he received a call from his employer that he needed to return back to work.  Pt states his treatment was interrupted to come home for [facility], but they still are going to let him go on 10-23-21. Stressors:  1) Job:  Exploring job options.  2) Finances:  Pt states he is in a lot of debt.  "I constantly worry about my finances.  3) Housing:  Pt is temporarily residing with his mother.  He states they argue all the time.  Pt denies any previous psych admissions or suicide attempts or gestures.  Has a new patient appt coming up with [facility] (psychiatrist on 10-23-21) and therapist on 11-28-21.  Also new pt appt with a PCP on 10-25-21.  Family hx:  Maternal  Aunt (Bipolar); Mom (Depressed) and Sister (Anxiety).   Pt attended 10 out of 14 MH-IOP days.  Treatment interrupted per pt's request in order to go on a planned trip to [place].  Pt reports that his mood has improved.  "I'm still trying to figure out my medications."  On a scale of 1-10 (10 being worst) pt rates his depression an anxiety at a 2.  Denies SI/HI or A/V hallucinations.   A:  D/C pt.  F/U with [place], NP in one month and Everett Graff (therapist) in August 2023.  Pt was advised of ROI must be obtained prior to any records release in order to collaborate his care with an outside provider.  Pt was advised if he has not already done so to contact the front desk to sign all necessary forms in order for MH-IOP to release info re: his care.  Consent:  Pt gives verbal consent for tx and assignment of benefits for services provided during this telehealth group process.  Pt expressed understanding and agreed to proceed. Collaboration of care:  Collaborate with September 2023, NP AEB and Hillery Jacks, LCSW AEB, [name], NP AEB, and Everett Graff (therapist) AEB.    R:  Pt receptive.  Loistine Chance, M.Ed,CNA

## 2021-10-26 NOTE — Progress Notes (Signed)
Virtual Visit via Video Note   I connected with [name] on 10/26/21 at  9:00 AM EDT by a video enabled telemedicine application and verified that I am speaking with the correct person using two identifiers.   At orientation to the IOP program, Case Manager discussed the limitations of evaluation and management by telemedicine and the availability of in person appointments. The patient expressed understanding and agreed to proceed with virtual visits throughout the duration of the program.   Location:  Patient: Patient Home Provider: Counselor Home Office   History of Present Illness: Adjustment Disorder with Mixed Anxiety and Depressed Mood    Observations/Objective: Check In: Case Manager checked in with all participants to review discharge dates, insurance authorizations, work-related documents and needs from the treatment team regarding medications. Caine stated needs and engaged in discussion.    Initial Therapeutic Activity: Counselor facilitated a check-in with Jamerion to assess for safety, sobriety and medication compliance.  Counselor also inquired about Neiko's current emotional ratings, as well as any significant changes in thoughts, feelings or behavior since previous check in.  Pryce presented for session on time and was alert, oriented x5, with no evidence or self-report of active SI/HI or A/V H.  Jovany reported compliance with medication and denied use of alcohol or illicit substances.  Marque reported scores of 2/10 for depression, 2/10 for anxiety, and 0/10 for anger/irritability.  Kinnick denied any recent outbursts or panic attacks.  Heinrich reported that a recent success was running errands around town, visiting his doctor, and then applying for unemployment.  Mikaele reported that a recent struggle was getting upset and moody last night, although he used his coping skills to distract himself.  Eladio reported that his goal today is to finish packing to prepare for his upcoming trip to  [place].        Second Therapeutic Activity: Counselor invited members to participate in peaceful place guided imagery activity today.  Counselor explained how this is a powerful visualization tool which can aid in reducing stress while increasing sense of calm, control, and awareness if practiced regularly.  Counselor informed members beforehand that if they became uncomfortable at any point during activity, they could stop and open their eyes.  Counselor invited members to get comfortable, achieve a relaxing breathing rhythm, close their eyes, and then guided them through process of creating a 'peaceful place' which filled them with safety and calm.  Counselor encouraged members to include sensory details involving vision, sound, touch, smell, and taste which they considered pleasant to enhance experience.  After 10 minutes of practice in session, counselor invited members to share their opinion on the activity, including whether they were able to imagine a specific place, what details stood out to them, and how this made them feel during and after.  Intervention was effective, as evidenced by [name] successfully participating in activity, and reporting that he was able to focus on activity and visualize a [place] place [place] he [place] time with friends at during a previous fourth of July holiday.  Brylen reported that this was relaxing, but somewhat melancholy since he misses those friends.  He reported that he would continue engaging in self-care activities like this to improve mood regulation.    Assessment and Plan: Jacobey has completed MHIOP today and will be discharged.  Counselor recommends adherence to crisis/safety plan, taking medications as prescribed, and following up with medical professionals if any issues arise.   Follow Up Instructions: Paxten was advised to call  back or seek an in-person evaluation if the symptoms worsen or if the condition fails to improve as anticipated.    Collaboration of Care:   Medication Management AEB Hillery Jacks, NP                                           Case Manager AEB Jeri Modena, CNA     Patient/Guardian was advised Release of Information must be obtained prior to any record release in order to collaborate their care with an outside provider. Patient/Guardian was advised if they have not already done so to contact the registration department to sign all necessary forms in order for [place] to release information regarding their care.    Consent: Patient/Guardian gives verbal consent for treatment and assignment of benefits for services provided during this visit. Patient/Guardian expressed understanding and agreed to proceed.   I provided 180 minutes of non-face-to-face time during this encounter.   [name], LCSW, LCAS 10/26/21

## 2021-11-27 ENCOUNTER — Encounter: Payer: Self-pay | Admitting: Cardiology

## 2021-11-27 ENCOUNTER — Ambulatory Visit: Payer: 59 | Admitting: Cardiology

## 2021-11-27 ENCOUNTER — Ambulatory Visit: Payer: Self-pay | Admitting: Cardiology

## 2021-11-27 VITALS — BP 134/88 | HR 76 | Temp 98.4°F | Resp 16 | Ht 64.0 in | Wt 237.4 lb

## 2021-11-27 DIAGNOSIS — Z8249 Family history of ischemic heart disease and other diseases of the circulatory system: Secondary | ICD-10-CM

## 2021-11-27 DIAGNOSIS — E7800 Pure hypercholesterolemia, unspecified: Secondary | ICD-10-CM

## 2021-11-27 DIAGNOSIS — I10 Essential (primary) hypertension: Secondary | ICD-10-CM

## 2021-11-27 DIAGNOSIS — E781 Pure hyperglyceridemia: Secondary | ICD-10-CM

## 2021-11-27 NOTE — Progress Notes (Signed)
ID:  Jerry [name], DOB 1990-11-20, MRN [id number]  PCP:  Patient, No Pcp Per  Cardiologist:  Tessa Lerner, DO, [facility] (established care 11/27/2021) Former Cardiology Providers: Dr. Gala Romney  REASON FOR CONSULT: Family history of premature CAD  REQUESTING PHYSICIAN:  Moshe Cipro, NP [address] [place],  [place] [address]  Chief Complaint  Patient presents with   Hypertension   Hyperlipidemia   Family history of coronary artery disease    New Patient (Initial Visit)    HPI  Jerry [name] is a 31 y.o. Caucasian male who presents to the clinic for evaluation of family history of premature CAD at the request of Moshe Cipro, NP. His past medical history and cardiovascular risk factors include: Family history of premature CAD (father died of MI at age 73), hypertension, hyperlipidemia, hypertriglyceridemia, marijuana use, vaping, bipolar disorder, depression, anxiety, panic disorder, HSV 1, obesity due to excess calories (Body mass index is 40.75 kg/m.).  At the age of 85 when he was getting vaccinated he was noted to have a blood pressure of 199 mmHg.  He was referred to Dr. Gala Romney for further evaluation and management.  Per EMR he underwent an echocardiogram, renal duplex, 24-hour urine collection for catecholamines, aldosterone renin ratio was normal, CPX noted possible mild exercise-induced asthma, and later referred to nephrology as well.  He was started on antihypertensive medications and thereafter patient moved to [place] followed by [place] and now is back in [place] wanting to reestablish care.  Clinically he denies any anginal discomfort or heart failure symptoms.  Majority of time when he checks his blood pressures SBP ranges between 118-130 mmHg.  Patient states that he is compliant with his current antihypertensive medications.  He has gained approximately 47 pounds since COVID-19 pandemic due to changing lifestyles.  FUNCTIONAL STATUS: No structured  exercise program or daily routine.   ALLERGIES: No Known Allergies  MEDICATION LIST PRIOR TO VISIT: Current Meds  Medication Sig   amLODipine (NORVASC) 10 MG tablet TAKE 1 TABLET BY MOUTH EVERY DAY **NEEDS OV**   Ascorbic Acid (VITAMIN C PO) Take 1 capsule by mouth daily at 12 noon.   busPIRone (BUSPAR) 15 MG tablet Take 15 mg by mouth 2 (two) times daily.   clotrimazole-betamethasone (LOTRISONE) cream Apply 1 Application topically in the morning and at bedtime.   divalproex (DEPAKOTE ER) 500 MG 24 hr tablet Take 500 mg by mouth 3 (three) times daily.   emtricitabine-tenofovir (TRUVADA) 200-300 MG tablet Take 1 tablet by mouth daily.   escitalopram (LEXAPRO) 20 MG tablet Take 20 mg by mouth daily.   gabapentin (NEURONTIN) 300 MG capsule Take by mouth.   hydrOXYzine (ATARAX) 50 MG tablet Take 50 mg by mouth at bedtime.   lisinopril-hydrochlorothiazide (ZESTORETIC) 20-12.5 MG tablet Take 1 tablet by mouth daily at 12 noon.   LORazepam (ATIVAN) 1 MG tablet Take 1 mg by mouth 2 (two) times daily as needed.   Melatonin 10 MG TABS Take 1 tablet by mouth daily as needed.   [DISCONTINUED] escitalopram (LEXAPRO) 10 MG tablet Take 1 tablet by mouth daily.     PAST MEDICAL HISTORY: Past Medical History:  Diagnosis Date   ADHD    Anxiety    Depression    Family history of premature CAD    HTN (hypertension)     PAST SURGICAL HISTORY: Past Surgical History:  Procedure Laterality Date   Wisdom teeth removal      FAMILY HISTORY: The patient family history includes Heart attack in  his paternal grandfather and paternal grandmother; Heart attack (age of onset: 21) in his father; Heart failure in his paternal grandmother.  SOCIAL HISTORY:  The patient  reports that he has never smoked. He has never used smokeless tobacco. He reports current alcohol use. He reports current drug use. Drugs: Cocaine and Marijuana.  REVIEW OF SYSTEMS: Review of Systems  Cardiovascular:  Negative for chest  pain, claudication, dyspnea on exertion, irregular heartbeat, leg swelling, near-syncope, orthopnea, palpitations, paroxysmal nocturnal dyspnea and syncope.  Respiratory:  Negative for shortness of breath.   Hematologic/Lymphatic: Negative for bleeding problem.  Musculoskeletal:  Negative for muscle cramps and myalgias.  Neurological:  Negative for dizziness and light-headedness.    PHYSICAL EXAM:    11/27/2021    2:10 PM 02/14/2020    2:36 PM 02/14/2020   10:34 AM  Vitals with BMI  Height 5\' 4"     Weight 237 lbs 6 oz    BMI 40.73    Systolic 134  140  Diastolic 88  78  Pulse 76  86     Information is confidential and restricted. Go to Review Flowsheets to unlock data.    CONSTITUTIONAL: Well-developed and well-nourished. No acute distress.  SKIN: Skin is warm and dry. No rash noted. No cyanosis. No pallor. No jaundice HEAD: Normocephalic and atraumatic.  EYES: No scleral icterus MOUTH/THROAT: Moist oral membranes.  NECK: No JVD present. No thyromegaly noted. No carotid bruits  LYMPHATIC: No visible cervical adenopathy.  CHEST Normal respiratory effort. No intercostal retractions  LUNGS: Clear to auscultation bilaterally.  No stridor. No wheezes. No rales.  CARDIOVASCULAR: Regular rate and rhythm, positive S1-S2, no murmurs rubs or gallops appreciated. ABDOMINAL: Obese, soft, nontender, nondistended, positive bowel sounds all 4 quadrants. No apparent ascites.  EXTREMITIES: No peripheral edema, warm to touch, +2 bilateral DP and PT pulses HEMATOLOGIC: No significant bruising NEUROLOGIC: Oriented to person, place, and time. Nonfocal. Normal muscle tone.  PSYCHIATRIC: Normal mood and affect. Normal behavior. Cooperative  CARDIAC DATABASE: EKG: 11/27/2021: Normal sinus rhythm, 68bpm, without underlying ischemia or injury pattern.    Echocardiogram: No results found for this or any previous visit from the past 1095 days.   Stress Testing: No results found for this or any  previous visit from the past 1095 days.  Heart Catheterization: None  LABORATORY DATA: External Labs: Collected: 05/21/2021 see Care Everywhere. Total cholesterol 197, triglycerides 238, HDL 45, LDL 104    CBC with Diff   2021-11-15    BA# 0.1   0.0-0.1  BA% 0.9   0.0-1.0  EO# 0.6   0.0-0.6  EO% 7.2   0.0-7.8  HCT 44.0   39.0-52.0  HGB 15.1   13.0-17.0  LY# 2.70   1.10-2.70  LY% 31.9   16.8-43.5  MCH 30.2   27.0-33.0  MCHC 34.2   32.0-36.0  MCV 88.4   80.0-94.0  MO# 0.5   0.3-0.8  MO% 6.0   4.6-12.4  MPV 8.2   7.5-10.7  NE# 4.6   1.9-7.2  NE% 54.0   43.3-71.9  NRBC# 0.02      NRBC% 0.20      PLT 345   150-400  RBC 4.99   4.20-5.80  RDW 12.7   11.5-15.5  WBC 8.6   4.0-11.0  Comp Metabolic Panel   12-13-1999    Albumin 4.7   3.4-4.8  ALP 57   38-126  ALT 33   0-52  Anion Gap 14.0   6.0-20.0  AST 25   0-39  BUN 15   6-26  CO2 28   22-32  CA-corrected 9.34   8.60-10.30  Calcium 10.0   8.6-10.3  Chloride 100   98-107  Creatinine 0.90   0.60-1.30  eGFR2021 118   >60  Glucose 72   70-99  Potassium 4.7   3.5-5.5  Sodium 138   136-145  Protein, Total 7.7   6.0-8.3  TBIL 0.5   0.3-1.0  CPK   2021-11-15    CPK 93   52-200  Reticulocyte Count (005280)   2021-11-15    Reticulocyte Count 2.0   0.6-2.6  Lipid Panel w/reflex   2021-11-15    LDL Chol Calc (NIH) 128   5-39  CHOL/HDL 5.4   2.0-4.0  Cholesterol 230   <200  HDLD 43   30-70  LDL Chol Calc (NIH) 128   7-67  NHDL 187   0-129  Triglyceride 335   0-199    IMPRESSION:    ICD-10-CM   1. Benign hypertension  I10 EKG 12-Lead    PCV CARDIAC STRESS TEST    PCV ECHOCARDIOGRAM COMPLETE    2. Hypercholesteremia  E78.00 PCV CARDIAC STRESS TEST    PCV ECHOCARDIOGRAM COMPLETE    3. Hypertriglyceridemia  E78.1 PCV CARDIAC STRESS TEST    PCV ECHOCARDIOGRAM COMPLETE    4. Family history of premature CAD  Z82.49 PCV CARDIAC STRESS TEST    PCV ECHOCARDIOGRAM COMPLETE    5. Class 3 severe obesity due to excess  calories with serious comorbidity and body mass index (BMI) of 40.0 to 44.9 in adult [facility])  E66.01    Z68.41        RECOMMENDATIONS: Jerone Cudmore is a 30 y.o. Caucasian male whose past medical history and cardiac risk factors include: Family history of premature CAD (father died of MI at age 20), hypertension, hyperlipidemia, hypertriglyceridemia, marijuana use, vaping, bipolar disorder, depression, anxiety, panic disorder, HSV 1, obesity due to excess calories (Body mass index is 40.75 kg/m.).  Benign hypertension Office blood pressures are well controlled. Has been evaluated by Drs. Bensimhon and Coladonato from cardiology and nephrology respectively for secondary causes of HTN in the past.  Please refer to EMR. Patient has gained approximately 47 pounds since the COVID-19 pandemic due to lifestyle changes.  He is educated on the importance of increasing physical activity as tolerated with a goal of 30 minutes a day 5 days a week to help facilitate weight loss and improve his blood pressures. Also educated on the importance of a low-salt diet (refer to plant-based diet or DASH diet).  Continue current medical therapy. Home blood pressures appear to be within acceptable range based on his reading.   Hypercholesteremia Outside labs from July 2023 independently reviewed.  Given his family history of premature CAD his lipid profile is not well controlled. Educated him on the importance of improving his LDL and triglyceride levels.  Educated him on the importance of reducing intake of foods that are high in triglycerides and lipids.  Increase physical activity with a goal of 30 minutes a day 5 days a week as tolerated Recommend repeating lipids after implementing lifestyle changes for 6 weeks - he will follow up w/ PCP for repeat labs.    Hypertriglyceridemia See above  Family history of premature CAD Denies angina pectoris. EKG nonischemic. Reeducated him on the importance of improving  his modifiable cardiovascular risk factors such as weight loss, lipid management, blood pressure management.  Echo will be ordered to evaluate for structural heart disease  and left ventricular systolic function. Plan exercise treadmill stress test to evaluate for functional status and reversible ischemia  Class 3 severe obesity due to excess calories with serious comorbidity and body mass index (BMI) of 40.0 to 44.9 in adult [facility] Body mass index is 40.75 kg/m. Patient states that he is gained 47 pounds during COVID pandemic. I reviewed with the patient the importance of diet, regular physical activity/exercise, weight loss.   Patient is educated on increasing physical activity gradually as tolerated.  With the goal of moderate intensity exercise for 30 minutes a day 5 days a week.  Data Reviewed: I have independently reviewed external notes provided by the referring provider as part of this office visit.   I have independently reviewed Dr. Prescott Gum prior office notes, outside labs, EKG as part of medical decision making. I have ordered the following tests:  Orders Placed This Encounter  Procedures   PCV CARDIAC STRESS TEST    Standing Status:   Future    Standing Expiration Date:   11/28/2022   EKG 12-Lead   PCV ECHOCARDIOGRAM COMPLETE    Standing Status:   Future    Standing Expiration Date:   11/28/2022  I have made no medications changes at today's encounter as noted above.  FINAL MEDICATION LIST END OF ENCOUNTER: No orders of the defined types were placed in this encounter.   Medications Discontinued During This Encounter  Medication Reason   escitalopram (LEXAPRO) 10 MG tablet Change in therapy   spironolactone (ALDACTONE) 25 MG tablet Change in therapy     Current Outpatient Medications:    amLODipine (NORVASC) 10 MG tablet, TAKE 1 TABLET BY MOUTH EVERY DAY **NEEDS OV**, Disp: 60 tablet, Rfl: 0   Ascorbic Acid (VITAMIN C PO), Take 1 capsule by mouth daily at 12 noon., Disp:  , Rfl:    busPIRone (BUSPAR) 15 MG tablet, Take 15 mg by mouth 2 (two) times daily., Disp: , Rfl:    clotrimazole-betamethasone (LOTRISONE) cream, Apply 1 Application topically in the morning and at bedtime., Disp: , Rfl:    divalproex (DEPAKOTE ER) 500 MG 24 hr tablet, Take 500 mg by mouth 3 (three) times daily., Disp: , Rfl:    emtricitabine-tenofovir (TRUVADA) 200-300 MG tablet, Take 1 tablet by mouth daily., Disp: , Rfl:    escitalopram (LEXAPRO) 20 MG tablet, Take 20 mg by mouth daily., Disp: , Rfl:    gabapentin (NEURONTIN) 300 MG capsule, Take by mouth., Disp: , Rfl:    hydrOXYzine (ATARAX) 50 MG tablet, Take 50 mg by mouth at bedtime., Disp: , Rfl:    lisinopril-hydrochlorothiazide (ZESTORETIC) 20-12.5 MG tablet, Take 1 tablet by mouth daily at 12 noon., Disp: , Rfl:    LORazepam (ATIVAN) 1 MG tablet, Take 1 mg by mouth 2 (two) times daily as needed., Disp: , Rfl:    Melatonin 10 MG TABS, Take 1 tablet by mouth daily as needed., Disp: , Rfl:   Orders Placed This Encounter  Procedures   PCV CARDIAC STRESS TEST   EKG 12-Lead   PCV ECHOCARDIOGRAM COMPLETE    There are no Patient Instructions on file for this visit.   --Continue cardiac medications as reconciled in final medication list. --Return in about 8 weeks (around 01/22/2022) for Review test results. or sooner if needed. --Continue follow-up with your primary care physician regarding the management of your other chronic comorbid conditions.  Patient's questions and concerns were addressed to his satisfaction. He voices understanding of the instructions provided during this encounter.  This note was created using a voice recognition software as a result there may be grammatical errors inadvertently enclosed that do not reflect the nature of this encounter. Every attempt is made to correct such errors.  Rex Kras, [place], [facility]  Pager: [phone] Office: [phone]

## 2021-12-28 ENCOUNTER — Ambulatory Visit: Payer: 59

## 2021-12-28 DIAGNOSIS — E7800 Pure hypercholesterolemia, unspecified: Secondary | ICD-10-CM

## 2021-12-28 DIAGNOSIS — Z8249 Family history of ischemic heart disease and other diseases of the circulatory system: Secondary | ICD-10-CM

## 2021-12-28 DIAGNOSIS — I10 Essential (primary) hypertension: Secondary | ICD-10-CM

## 2021-12-28 DIAGNOSIS — E781 Pure hyperglyceridemia: Secondary | ICD-10-CM

## 2022-01-02 ENCOUNTER — Ambulatory Visit: Payer: 59

## 2022-01-02 DIAGNOSIS — I10 Essential (primary) hypertension: Secondary | ICD-10-CM

## 2022-01-02 DIAGNOSIS — E781 Pure hyperglyceridemia: Secondary | ICD-10-CM

## 2022-01-02 DIAGNOSIS — Z8249 Family history of ischemic heart disease and other diseases of the circulatory system: Secondary | ICD-10-CM

## 2022-01-02 DIAGNOSIS — E7800 Pure hypercholesterolemia, unspecified: Secondary | ICD-10-CM

## 2022-01-21 ENCOUNTER — Ambulatory Visit: Payer: 59 | Admitting: Cardiology

## 2022-01-21 ENCOUNTER — Encounter: Payer: Self-pay | Admitting: Cardiology

## 2022-01-21 VITALS — BP 122/71 | HR 85 | Temp 98.0°F | Resp 16 | Ht 64.0 in | Wt 236.0 lb

## 2022-01-21 DIAGNOSIS — Z8249 Family history of ischemic heart disease and other diseases of the circulatory system: Secondary | ICD-10-CM

## 2022-01-21 DIAGNOSIS — E781 Pure hyperglyceridemia: Secondary | ICD-10-CM

## 2022-01-21 DIAGNOSIS — E7800 Pure hypercholesterolemia, unspecified: Secondary | ICD-10-CM

## 2022-01-21 DIAGNOSIS — I10 Essential (primary) hypertension: Secondary | ICD-10-CM

## 2022-01-21 NOTE — Progress Notes (Signed)
ID:  Jerry [name], DOB March 02, 1991, MRN [id number]  PCP:  Faustino Congress, NP  Cardiologist:  Rex Kras, DO, [facility] (established care 11/27/2021) Former Cardiology Providers: Dr. Haroldine Laws  Date: 01/21/22 Last Office Visit: 11/27/2021  Chief Complaint  Patient presents with   Follow-up    8 week    Results    labs    HPI  Jerry [name] is a 32 y.o. Caucasian male whose past medical history and cardiovascular risk factors include: Family history of premature CAD (father died of MI at age 8), hypertension, hyperlipidemia, hypertriglyceridemia, marijuana use, vaping, bipolar disorder, depression, anxiety, panic disorder, HSV 1, obesity due to excess calories (Body mass index is 40.51 kg/m.).  The patient was referred to the practice by his PCP given his family history of premature CAD.  Patient was diagnosed with benign essential hypertension at the age of 50 and underwent extensive cardiovascular work-up under Dr. Haroldine Laws as outlined below.  Since then he had moved to [place] and now is back in [place] to reestablish care.  At last office visit the shared decision was to proceed with an echocardiogram and GXT.  Results reviewed with him in detail and noted below for further reference.  Patient denies anginal discomfort or heart failure symptoms.  Since last office visit he is now employed as a Doctor, general practice which is increased to some degree of physical activity on a regular basis.  He denies any anginal discomfort as a result.  Of note, he has gained approximately 47 pounds during the COVID-19 pandemic and is motivated to lose the weight.  FUNCTIONAL STATUS: No structured exercise program or daily routine.   ALLERGIES: No Known Allergies  MEDICATION LIST PRIOR TO VISIT: No outpatient medications have been marked as taking for the 01/21/22 encounter (Office Visit) with Terri Skains, Kailly Richoux, DO.     PAST MEDICAL HISTORY: Past Medical History:  Diagnosis Date   ADHD     Anxiety    Depression    Family history of premature CAD    HTN (hypertension)     PAST SURGICAL HISTORY: Past Surgical History:  Procedure Laterality Date   Wisdom teeth removal      FAMILY HISTORY: The patient family history includes Heart attack in his paternal grandfather and paternal grandmother; Heart attack (age of onset: 49) in his father; Heart failure in his paternal grandmother.  SOCIAL HISTORY:  The patient  reports that he has never smoked. He has never used smokeless tobacco. He reports current alcohol use. He reports current drug use. Drugs: Cocaine and Marijuana.  REVIEW OF SYSTEMS: Review of Systems  Cardiovascular:  Negative for chest pain, claudication, dyspnea on exertion, irregular heartbeat, leg swelling, near-syncope, orthopnea, palpitations, paroxysmal nocturnal dyspnea and syncope.  Respiratory:  Negative for shortness of breath.   Hematologic/Lymphatic: Negative for bleeding problem.  Musculoskeletal:  Negative for muscle cramps and myalgias.  Neurological:  Negative for dizziness and light-headedness.    PHYSICAL EXAM:    01/21/2022    3:06 PM 11/27/2021    2:10 PM 02/14/2020    2:36 PM  Vitals with BMI  Height 5\' 4"  5\' 4"    Weight 236 lbs 237 lbs 6 oz   BMI 53.66 44.03   Systolic 474 259   Diastolic 71 88   Pulse 85 76      Information is confidential and restricted. Go to Review Flowsheets to unlock data.    CONSTITUTIONAL: Well-developed and well-nourished. No acute distress.  SKIN: Skin is warm and dry. No rash  noted. No cyanosis. No pallor. No jaundice HEAD: Normocephalic and atraumatic.  EYES: No scleral icterus MOUTH/THROAT: Moist oral membranes.  NECK: No JVD present. No thyromegaly noted. No carotid bruits  CHEST Normal respiratory effort. No intercostal retractions  LUNGS: Clear to auscultation bilaterally.  No stridor. No wheezes. No rales.  CARDIOVASCULAR: Regular rate and rhythm, positive S1-S2, no murmurs rubs or gallops  appreciated. ABDOMINAL: Obese, soft, nontender, nondistended, positive bowel sounds all 4 quadrants. No apparent ascites.  EXTREMITIES: No peripheral edema, warm to touch, +2 bilateral DP and PT pulses HEMATOLOGIC: No significant bruising NEUROLOGIC: Oriented to person, place, and time. Nonfocal. Normal muscle tone.  PSYCHIATRIC: Normal mood and affect. Normal behavior. Cooperative  CARDIAC DATABASE: EKG: 11/27/2021: Normal sinus rhythm, 68bpm, without underlying ischemia or injury pattern.    Echocardiogram: 01/02/2022: Left ventricle cavity is normal in size and wall thickness. Normal global wall motion. Normal LV systolic function with EF 59%. Normal diastolic filling pattern.  No significant valvular abnormalities. Normal right atrial pressure.   Stress Testing: Exercise treadmill stress test 12/28/2021: Exercise treadmill stress test performed using Bruce protocol. Patient reached 8.5 METS, and 91% of age predicted maximum heart rate. Exercise capacity was fair. No chest pain reported. Normal heart rate and hemodynamic response.  Stress EKG showed sinus tachycardia, <1 mm horizontal ST depression in leads II, III, aVF, V5, V6 that normalize within 1 min into recovery. These changes are equivocal for ischemia.  Low risk study.  Heart Catheterization: None  LABORATORY DATA: External Labs: Collected: 05/21/2021 see Care Everywhere. Total cholesterol 197, triglycerides 238, HDL 45, LDL 104    CBC with Diff   2021-11-15    BA# 0.1   0.0-0.1  BA% 0.9   0.0-1.0  EO# 0.6   0.0-0.6  EO% 7.2   0.0-7.8  HCT 44.0   39.0-52.0  HGB 15.1   13.0-17.0  LY# 2.70   1.10-2.70  LY% 31.9   16.8-43.5  MCH 30.2   27.0-33.0  MCHC 34.2   32.0-36.0  MCV 88.4   80.0-94.0  MO# 0.5   0.3-0.8  MO% 6.0   4.6-12.4  MPV 8.2   7.5-10.7  NE# 4.6   1.9-7.2  NE% 54.0   43.3-71.9  NRBC# 0.02      NRBC% 0.20      PLT 345   150-400  RBC 4.99   4.20-5.80  RDW 12.7   11.5-15.5  WBC 8.6   4.0-11.0  Comp  Metabolic Panel   174-02-20    Albumin 4.7   3.4-4.8  ALP 57   38-126  ALT 33   0-52  Anion Gap 14.0   6.0-20.0  AST 25   0-39  BUN 15   6-26  CO2 28   22-32  CA-corrected 9.34   8.60-10.30  Calcium 10.0   8.6-10.3  Chloride 100   98-107  Creatinine 0.90   0.60-1.30  eGFR2021 118   >60  Glucose 72   70-99  Potassium 4.7   3.5-5.5  Sodium 138   136-145  Protein, Total 7.7   6.0-8.3  TBIL 0.5   0.3-1.0  CPK   2021-11-15    CPK 93   52-200  Reticulocyte Count (005280)   2021-11-15    Reticulocyte Count 2.0   0.6-2.6  Lipid Panel w/reflex   2021-11-15    LDL Chol Calc (NIH) 128   8-52  CHOL/HDL 5.4   2.0-4.0  Cholesterol 230   <200  HDLD 43   30-70  LDL  Chol Calc (NIH) 128   0-99  NHDL 187   0-129  Triglyceride 335   0-199    IMPRESSION:    ICD-10-CM   1. Benign hypertension  I10     2. Hypercholesteremia  E78.00     3. Hypertriglyceridemia  E78.1     4. Family history of premature CAD  Z82.49     5. Class 3 severe obesity due to excess calories with serious comorbidity and body mass index (BMI) of 40.0 to 44.9 in adult [facility])  E66.01    Z68.41        RECOMMENDATIONS: Jerry [name] is a 31 y.o. Caucasian male whose past medical history and cardiac risk factors include: Family history of premature CAD (father died of MI at age 20), hypertension, hyperlipidemia, hypertriglyceridemia, marijuana use, vaping, bipolar disorder, depression, anxiety, panic disorder, HSV 1, obesity due to excess calories (Body mass index is 40.51 kg/m.).  Benign hypertension Office blood pressures are well controlled. No medication changes warranted at this time. Diagnosis benign essential hypertension at a relatively young age and has undergone secondary work-up with Drs. Bensimhon and Coladonato.  We emphasized the importance of weight loss with increasing physical activity as tolerated with a goal of 30 minutes a day 5 days a week of moderate intensity exercise.  Reemphasized importance  of low-salt diet (i.e. DASH diet)  Hypercholesteremia  / Hypertriglyceridemia Labs from July 2023 independently reviewed again at today's office visit. Triglycerides are not well controlled and LDL could be better controlled. Reemphasized on having them rechecked after implementing lifestyle changes. Patient states that he will follow-up with PCP. Overall diagnostic yield for coronary calcium score would be low at his age; however, given his family history of premature CAD and his current lipid profile it could still be considered an option for further risk stratification.  Class 3 severe obesity due to excess calories with serious comorbidity and body mass index (BMI) of 40.0 to 44.9 in adult [facility]) Body mass index is 40.51 kg/m. I reviewed with the patient the importance of diet, regular physical activity/exercise, weight loss.   Patient is educated on increasing physical activity gradually as tolerated.  With the goal of moderate intensity exercise for 30 minutes a day 5 days a week.  Reeducated him on the importance of improving his modifiable cardiovascular risk factors such as weight loss, lipid management, blood pressure management.   FINAL MEDICATION LIST END OF ENCOUNTER: No orders of the defined types were placed in this encounter.   Medications Discontinued During This Encounter  Medication Reason   hydrOXYzine (ATARAX) 50 MG tablet      Current Outpatient Medications:    amLODipine (NORVASC) 10 MG tablet, TAKE 1 TABLET BY MOUTH EVERY DAY **NEEDS OV**, Disp: 60 tablet, Rfl: 0   Ascorbic Acid (VITAMIN C PO), Take 1 capsule by mouth daily at 12 noon., Disp: , Rfl:    busPIRone (BUSPAR) 15 MG tablet, Take 15 mg by mouth 2 (two) times daily., Disp: , Rfl:    clotrimazole-betamethasone (LOTRISONE) cream, Apply 1 Application topically in the morning and at bedtime., Disp: , Rfl:    divalproex (DEPAKOTE ER) 500 MG 24 hr tablet, Take 500 mg by mouth 3 (three) times daily., Disp: ,  Rfl:    emtricitabine-tenofovir (TRUVADA) 200-300 MG tablet, Take 1 tablet by mouth daily., Disp: , Rfl:    escitalopram (LEXAPRO) 20 MG tablet, Take 20 mg by mouth daily., Disp: , Rfl:    gabapentin (NEURONTIN) 300 MG capsule, Take  by mouth., Disp: , Rfl:    lisinopril-hydrochlorothiazide (ZESTORETIC) 20-12.5 MG tablet, Take 1 tablet by mouth daily at 12 noon., Disp: , Rfl:    LORazepam (ATIVAN) 1 MG tablet, Take 1 mg by mouth 2 (two) times daily as needed., Disp: , Rfl:    Melatonin 10 MG TABS, Take 1 tablet by mouth daily as needed., Disp: , Rfl:   No orders of the defined types were placed in this encounter.   There are no Patient Instructions on file for this visit.   --Continue cardiac medications as reconciled in final medication list. --Return in about 1 year (around 01/22/2023) for Annual follow up visit. or sooner if needed. --Continue follow-up with your primary care physician regarding the management of your other chronic comorbid conditions.  Patient's questions and concerns were addressed to his satisfaction. He voices understanding of the instructions provided during this encounter.   This note was created using a voice recognition software as a result there may be grammatical errors inadvertently enclosed that do not reflect the nature of this encounter. Every attempt is made to correct such errors.  Tessa Lerner, [place], [facility]  Pager: [phone] Office: [phone]

## 2023-01-02 ENCOUNTER — Telehealth: Payer: Self-pay | Admitting: Pharmacy Technician

## 2023-01-02 ENCOUNTER — Other Ambulatory Visit (HOSPITAL_COMMUNITY): Payer: Self-pay

## 2023-01-02 NOTE — Telephone Encounter (Signed)
 [facility] Patient Advocate Encounter  Insurance verification completed.    The patient is uninsured and will need patient assistance for medication.  We can complete the application and will need to meet with the patient for signatures and income documentation.

## 2023-01-09 ENCOUNTER — Encounter: Payer: Self-pay | Admitting: Pharmacist

## 2023-01-09 ENCOUNTER — Ambulatory Visit (INDEPENDENT_AMBULATORY_CARE_PROVIDER_SITE_OTHER): Payer: Commercial Managed Care - PPO | Admitting: Pharmacist

## 2023-01-09 ENCOUNTER — Other Ambulatory Visit: Payer: Self-pay

## 2023-01-09 ENCOUNTER — Other Ambulatory Visit (HOSPITAL_COMMUNITY)
Admission: RE | Admit: 2023-01-09 | Discharge: 2023-01-09 | Disposition: A | Discharge status: Home / Self Care | Payer: Commercial Managed Care - PPO | Source: Ambulatory Visit | Attending: Infectious Disease | Admitting: Infectious Disease

## 2023-01-09 ENCOUNTER — Other Ambulatory Visit (HOSPITAL_COMMUNITY): Payer: Self-pay

## 2023-01-09 DIAGNOSIS — Z113 Encounter for screening for infections with a predominantly sexual mode of transmission: Secondary | ICD-10-CM

## 2023-01-09 DIAGNOSIS — Z79899 Other long term (current) drug therapy: Secondary | ICD-10-CM | POA: Diagnosis not present

## 2023-01-09 NOTE — Addendum Note (Signed)
Addended by: Jennette Kettle on: 01/09/2023 01:52 PM   Modules accepted: Orders

## 2023-01-09 NOTE — Progress Notes (Signed)
NEW REFERRAL TO CPP CLINIC    Date:  01/09/2023   HPI: Jerry [name] is a 32 y.o. male who presents to the [facility] clinic to discuss and initiate PrEP.  Insured   [x]    Uninsured  []    Patient Active Problem List   Diagnosis Date Noted   Panic attack 02/14/2020   GAD (generalized anxiety disorder) 02/14/2020   Family discord 02/14/2020   DYSPNEA 10/13/2009   ESSENTIAL HYPERTENSION, BENIGN 06/27/2009   HYPERTENSION, UNSPECIFIED 06/16/2009   CHEST PAIN-UNSPECIFIED 06/16/2009    Patient's Medications  New Prescriptions   No medications on file  Previous Medications   AMLODIPINE (NORVASC) 10 MG TABLET    TAKE 1 TABLET BY MOUTH EVERY DAY **NEEDS OV**   ASCORBIC ACID (VITAMIN C PO)    Take 1 capsule by mouth daily at 12 noon.   BUSPIRONE (BUSPAR) 15 MG TABLET    Take 15 mg by mouth 2 (two) times daily.   CLOTRIMAZOLE-BETAMETHASONE (LOTRISONE) CREAM    Apply 1 Application topically in the morning and at bedtime.   DIVALPROEX (DEPAKOTE ER) 500 MG 24 HR TABLET    Take 500 mg by mouth 3 (three) times daily.   EMTRICITABINE-TENOFOVIR (TRUVADA) 200-300 MG TABLET    Take 1 tablet by mouth daily.   ESCITALOPRAM (LEXAPRO) 20 MG TABLET    Take 20 mg by mouth daily.   GABAPENTIN (NEURONTIN) 300 MG CAPSULE    Take by mouth.   LISINOPRIL-HYDROCHLOROTHIAZIDE (ZESTORETIC) 20-12.5 MG TABLET    Take 1 tablet by mouth daily at 12 noon.   LORAZEPAM (ATIVAN) 1 MG TABLET    Take 1 mg by mouth 2 (two) times daily as needed.   MELATONIN 10 MG TABS    Take 1 tablet by mouth daily as needed.  Modified Medications   No medications on file  Discontinued Medications   No medications on file    Allergies: No Known Allergies  Past Medical History: Past Medical History:  Diagnosis Date   ADHD    Anxiety    Depression    Family history of premature CAD    HTN (hypertension)     Social History: Social History   Socioeconomic History   Marital status: Single    Spouse name: Not on file    Number of children: Not on file   Years of education: Not on file   Highest education level: Not on file  Occupational History   Occupation: Single  Tobacco Use   Smoking status: Never   Smokeless tobacco: Never  Vaping Use   Vaping status: Some Days   Substances: THC  Substance and Sexual Activity   Alcohol use: Yes    Comment: occ   Drug use: Yes    Types: Cocaine, Marijuana   Sexual activity: Yes    Birth control/protection: None  Other Topics Concern   Not on file  Social History Narrative   Not on file   Social Determinants of Health   Financial Resource Strain: Not on file  Food Insecurity: Not on file  Transportation Needs: Not on file  Physical Activity: Not on file  Stress: Not on file  Social Connections: Not on file       01/09/2023    1:29 PM  CH[facility] HIV PREP FLOWSHEET RESULTS  Insurance Status Insured  How did you hear? referred by PCP  Gender at birth Male  Gender identity cis-Male  Sex Partners Men only  Sex activity preferences Insertive;Oral  Treated for STI?  No  HIV symptoms? None  PrEP Eligibility Yes    Labs:  SCr: Lab Results  Component Value Date   CREATININE 1.0 06/27/2009   HIV No results found for: "HIV" Hepatitis B No results found for: "HEPBSAB", "HEPBSAG", "HEPBCAB" Hepatitis C No results found for: "HEPCAB", "HCVRNAPCRQN" Hepatitis A No results found for: "HAV" RPR and STI No results found for: "LABRPR", "RPRTITER"      No data to display          Assessment: Jerry [name] presents to clinic today for PrEP management. His PCP Moshe Cipro has been managing his PrEP for years, but she is moving from his current practice. His new PCP is unable to prescribe PrEP for him, so he was referred here. Discussed he can use the PrEP locator website to find a different PCP practice or reach out to his current office to establish with a different PCP who would manage his PrEP care. Reviewed we are happy to follow him here but  understand wanting to limit office visits. His PCP last checked his HIV and STI testing in July. He takes Truvada daily without any missed doses or adverse effects. [facility] [address].   Able to look up recent labs through [facility]: 7/30 RPR non-reactive, HAV Sab positive, HBV sAb reactive, HCV Ab non-reactive, HIV ab non-reactive. Could not find BMP within the last year. Will check HIV antibody, BMP, and urine/oral cytologies as he is always the top partner and has participated in oral sex.   Due for annual flu, updated COVID, and Tdap booster vaccination. Will reach out to PCP for management and politely declined these today.   Scheduled 3-month follow up with [place], but he may cancel pending establishment with another PCP for PrEP care.   Plan: - Check HIV antibody, Bmet, RPR, and urine/oral cytologies - Refill Truvada if HIV antibody negative - Follow up with me on 12/12  Margarite Gouge, PharmD, CPP, BCIDP, AAHIVP Clinical Pharmacist Practitioner Infectious Diseases Clinical Pharmacist [facility] 01/09/2023, 1:30 PM

## 2023-01-10 ENCOUNTER — Other Ambulatory Visit: Payer: Self-pay | Admitting: Pharmacist

## 2023-01-10 DIAGNOSIS — Z79899 Other long term (current) drug therapy: Secondary | ICD-10-CM

## 2023-01-10 LAB — BASIC METABOLIC PANEL
BUN: 15 mg/dL (ref 7–25)
CO2: 29 mmol/L (ref 20–32)
Calcium: 10 mg/dL (ref 8.6–10.3)
Chloride: 101 mmol/L (ref 98–110)
Creat: 1 mg/dL (ref 0.60–1.26)
Glucose, Bld: 120 mg/dL — ABNORMAL HIGH (ref 65–99)
Potassium: 4.1 mmol/L (ref 3.5–5.3)
Sodium: 138 mmol/L (ref 135–146)

## 2023-01-10 LAB — URINE CYTOLOGY ANCILLARY ONLY
Chlamydia: NEGATIVE
Comment: NEGATIVE
Comment: NORMAL
Neisseria Gonorrhea: NEGATIVE

## 2023-01-10 LAB — HIV ANTIBODY (ROUTINE TESTING W REFLEX): HIV 1&2 Ab, 4th Generation: NONREACTIVE

## 2023-01-10 LAB — CYTOLOGY, (ORAL, ANAL, URETHRAL) ANCILLARY ONLY
Chlamydia: NEGATIVE
Comment: NEGATIVE
Comment: NORMAL
Neisseria Gonorrhea: NEGATIVE

## 2023-01-10 LAB — RPR: RPR Ser Ql: NONREACTIVE

## 2023-01-10 MED ORDER — EMTRICITABINE-TENOFOVIR DF 200-300 MG PO TABS: 1.0000 | ORAL_TABLET | Freq: Every day | ORAL | 2 refills | Status: DC

## 2023-01-23 ENCOUNTER — Ambulatory Visit: Payer: Self-pay | Admitting: Cardiology

## 2023-04-08 NOTE — Progress Notes (Unsigned)
Date:  04/08/2023   HPI: Jerry [name] is a 32 y.o. male who presents to the [facility] pharmacy clinic for HIV PrEP follow-up.  Insured   [x]    Uninsured  []    Patient Active Problem List   Diagnosis Date Noted   Panic attack 02/14/2020   GAD (generalized anxiety disorder) 02/14/2020   Family discord 02/14/2020   DYSPNEA 10/13/2009   ESSENTIAL HYPERTENSION, BENIGN 06/27/2009   Essential hypertension 06/16/2009   CHEST PAIN-UNSPECIFIED 06/16/2009    Patient's Medications  New Prescriptions   No medications on file  Previous Medications   AMLODIPINE (NORVASC) 10 MG TABLET    TAKE 1 TABLET BY MOUTH EVERY DAY **NEEDS OV**   ASCORBIC ACID (VITAMIN C PO)    Take 1 capsule by mouth daily at 12 noon.   BUSPIRONE (BUSPAR) 15 MG TABLET    Take 15 mg by mouth 2 (two) times daily.   CLOTRIMAZOLE-BETAMETHASONE (LOTRISONE) CREAM    Apply 1 Application topically in the morning and at bedtime.   DIVALPROEX (DEPAKOTE ER) 500 MG 24 HR TABLET    Take 500 mg by mouth 3 (three) times daily.   EMTRICITABINE-TENOFOVIR (TRUVADA) 200-300 MG TABLET    Take 1 tablet by mouth daily.   EMTRICITABINE-TENOFOVIR (TRUVADA) 200-300 MG TABLET    Take 1 tablet by mouth daily.   ESCITALOPRAM (LEXAPRO) 20 MG TABLET    Take 20 mg by mouth daily.   GABAPENTIN (NEURONTIN) 300 MG CAPSULE    Take by mouth.   LISINOPRIL-HYDROCHLOROTHIAZIDE (ZESTORETIC) 20-12.5 MG TABLET    Take 1 tablet by mouth daily at 12 noon.   LORAZEPAM (ATIVAN) 1 MG TABLET    Take 1 mg by mouth 2 (two) times daily as needed.   MELATONIN 10 MG TABS    Take 1 tablet by mouth daily as needed.  Modified Medications   No medications on file  Discontinued Medications   No medications on file    Allergies: No Known Allergies  Past Medical History: Past Medical History:  Diagnosis Date   ADHD    Anxiety    Depression    Family history of premature CAD    HTN (hypertension)     Social History: Social History   Socioeconomic History   Marital  status: Single    Spouse name: Not on file   Number of children: Not on file   Years of education: Not on file   Highest education level: Not on file  Occupational History   Occupation: Single  Tobacco Use   Smoking status: Never   Smokeless tobacco: Never  Vaping Use   Vaping status: Some Days   Substances: THC  Substance and Sexual Activity   Alcohol use: Yes    Comment: occ   Drug use: Yes    Types: Cocaine, Marijuana   Sexual activity: Yes    Birth control/protection: None  Other Topics Concern   Not on file  Social History Narrative   Not on file   Social Determinants of Health   Financial Resource Strain: Not on file  Food Insecurity: Not on file  Transportation Needs: Not on file  Physical Activity: Not on file  Stress: Not on file  Social Connections: Not on file       01/09/2023    1:29 PM  CHL HIV PREP FLOWSHEET RESULTS  Insurance Status Insured  How did you hear? referred by PCP  Gender at birth Male  Gender identity cis-Male  Sex Partners Men only  Sex activity preferences Insertive;Oral  Treated for STI? No  HIV symptoms? None  PrEP Eligibility Yes    Labs:  SCr: Lab Results  Component Value Date   CREATININE 1.00 01/09/2023   CREATININE 1.0 06/27/2009   HIV Lab Results  Component Value Date   HIV NON-REACTIVE 01/09/2023   Hepatitis B No results found for: "HEPBSAB", "HEPBSAG", "HEPBCAB" Hepatitis C No results found for: "HEPCAB", "HCVRNAPCRQN" Hepatitis A No results found for: "HAV" RPR and STI Lab Results  Component Value Date   LABRPR NON-REACTIVE 01/09/2023    STI Results GC CT  01/09/2023  1:52 PM Negative  Negative   01/09/2023  1:16 PM Negative  Negative     Assessment: Jerry [name] presents to clinic today for PrEP follow-up. Denies any missed doses or adverse effects.  Screened patient for acute HIV symptoms such as fatigue, muscle aches, rash, sore throat, lymphadenopathy, headache, night sweats, nausea/vomiting/diarrhea,  and fever.  Still has ~*** tablets left over. [facility] Will check HIV antibody and send in refill once it results negative.  Last STI screening was in September and was negative. *** new sexual partners since last visit; will check *** today.  Plan: - Check HIV antibody - If HIV antibody, refill Truvada x 3 months - Follow-up with me on ***   Margarite Gouge, PharmD, CPP, BCIDP, AAHIVP Clinical Pharmacist Practitioner Infectious Diseases Clinical Pharmacist [facility] 04/08/2023, 1:41 PM

## 2023-04-10 ENCOUNTER — Ambulatory Visit: Payer: Medicaid Other | Admitting: Pharmacist

## 2023-04-10 DIAGNOSIS — Z113 Encounter for screening for infections with a predominantly sexual mode of transmission: Secondary | ICD-10-CM

## 2023-04-10 DIAGNOSIS — Z79899 Other long term (current) drug therapy: Secondary | ICD-10-CM

## 2023-05-02 NOTE — Progress Notes (Signed)
 HPI: Jerry [name] is a 33 y.o. male who presents to the [facility] pharmacy clinic for HIV PrEP follow-up.  Insured   [x]    Uninsured  []    Patient Active Problem List   Diagnosis Date Noted   Panic attack 02/14/2020   GAD (generalized anxiety disorder) 02/14/2020   Family discord 02/14/2020   DYSPNEA 10/13/2009   ESSENTIAL HYPERTENSION, BENIGN 06/27/2009   Essential hypertension 06/16/2009   CHEST PAIN-UNSPECIFIED 06/16/2009    Patient's Medications  New Prescriptions   No medications on file  Previous Medications   AMLODIPINE  (NORVASC ) 10 MG TABLET    TAKE 1 TABLET BY MOUTH EVERY DAY **NEEDS OV**   ASCORBIC ACID (VITAMIN C PO)    Take 1 capsule by mouth daily at 12 noon.   BUSPIRONE (BUSPAR) 15 MG TABLET    Take 15 mg by mouth 2 (two) times daily.   CLOTRIMAZOLE-BETAMETHASONE (LOTRISONE) CREAM    Apply 1 Application topically in the morning and at bedtime.   DIVALPROEX (DEPAKOTE ER) 500 MG 24 HR TABLET    Take 500 mg by mouth 3 (three) times daily.   EMTRICITABINE -TENOFOVIR  (TRUVADA) 200-300 MG TABLET    Take 1 tablet by mouth daily.   EMTRICITABINE -TENOFOVIR  (TRUVADA) 200-300 MG TABLET    Take 1 tablet by mouth daily.   ESCITALOPRAM (LEXAPRO) 20 MG TABLET    Take 20 mg by mouth daily.   GABAPENTIN (NEURONTIN) 300 MG CAPSULE    Take by mouth.   LISINOPRIL-HYDROCHLOROTHIAZIDE (ZESTORETIC) 20-12.5 MG TABLET    Take 1 tablet by mouth daily at 12 noon.   LORAZEPAM (ATIVAN) 1 MG TABLET    Take 1 mg by mouth 2 (two) times daily as needed.   MELATONIN 10 MG TABS    Take 1 tablet by mouth daily as needed.  Modified Medications   No medications on file  Discontinued Medications   No medications on file       01/09/2023    1:29 PM  [facility] HIV PREP FLOWSHEET RESULTS  Insurance Status Insured  How did you hear? referred by PCP  Gender at birth Male  Gender identity cis-Male  Sex Partners Men only  Sex activity preferences Insertive;Oral  Treated for STI? No  HIV symptoms? None  PrEP  Eligibility Yes    Labs:  SCr: Lab Results  Component Value Date   CREATININE 1.00 01/09/2023   CREATININE 1.0 06/27/2009   HIV Lab Results  Component Value Date   HIV NON-REACTIVE 01/09/2023   Hepatitis B No results found for: HEPBSAB, HEPBSAG, HEPBCAB Hepatitis C No results found for: HEPCAB, HCVRNAPCRQN Hepatitis A No results found for: HAV RPR and STI Lab Results  Component Value Date   LABRPR NON-REACTIVE 01/09/2023    STI Results GC CT  01/09/2023  1:52 PM Negative  Negative   01/09/2023  1:16 PM Negative  Negative     Assessment: Jerry [name] is here today to follow up for HIV PrEP. He saw Jerry [name] back in September to establish care as a patient. He was receiving Truvada from his PCP but had to switch PCPs when his current one left the practice. He has not found a new PCP as of yet.  He is tolerating Truvada well with no side effects. He does not miss any doses and takes it every day. He is almost out of his 90 day supply he got in September. No changes with his Medicaid for 2025. Screened for acute HIV symptoms such as fatigue, muscle aches, rash, sore  throat, lymphadenopathy, headache, night sweats, nausea/vomiting/diarrhea, and fever. Denies any symptoms.  Accepts STI testing today and requesting just a urine cytology. No other issues. Will see him back in 3 months. Administered the flu vaccine today.  Plan: - HIV antibody, RPR, and urine cytology today - Truvada x 3 months if HIV negative - Flu vaccine today - Follow up with Jerry [name] on 07/30/23  Jerry [name] L. Jerry [name], PharmD, BCIDP, AAHIVP, CPP Clinical Pharmacist Practitioner Infectious Diseases Clinical Pharmacist [facility] 05/02/2023, 3:15 PM

## 2023-05-05 ENCOUNTER — Other Ambulatory Visit: Payer: Self-pay

## 2023-05-05 ENCOUNTER — Ambulatory Visit: Payer: Medicaid Other | Admitting: Pharmacist

## 2023-05-05 DIAGNOSIS — Z23 Encounter for immunization: Secondary | ICD-10-CM | POA: Diagnosis present

## 2023-05-05 DIAGNOSIS — Z113 Encounter for screening for infections with a predominantly sexual mode of transmission: Secondary | ICD-10-CM

## 2023-05-05 DIAGNOSIS — Z79899 Other long term (current) drug therapy: Secondary | ICD-10-CM

## 2023-05-06 LAB — C. TRACHOMATIS/N. GONORRHOEAE RNA
C. trachomatis RNA, TMA: NOT DETECTED
N. gonorrhoeae RNA, TMA: NOT DETECTED

## 2023-05-06 LAB — HIV ANTIBODY (ROUTINE TESTING W REFLEX): HIV 1&2 Ab, 4th Generation: NONREACTIVE

## 2023-05-06 LAB — RPR: RPR Ser Ql: NONREACTIVE

## 2023-05-07 ENCOUNTER — Other Ambulatory Visit: Payer: Self-pay | Admitting: Pharmacist

## 2023-05-07 DIAGNOSIS — Z79899 Other long term (current) drug therapy: Secondary | ICD-10-CM

## 2023-05-07 MED ORDER — EMTRICITABINE-TENOFOVIR DF 200-300 MG PO TABS: 1.0000 | ORAL_TABLET | Freq: Every day | ORAL | 2 refills | Status: AC

## 2023-07-28 NOTE — Progress Notes (Deleted)
 Date:  07/28/2023   HPI: Jerry [name] is a 33 y.o. male who presents to the [facility] pharmacy clinic for HIV PrEP follow-up.  Insured   [x]    Uninsured  []    Patient Active Problem List   Diagnosis Date Noted   Panic attack 02/14/2020   GAD (generalized anxiety disorder) 02/14/2020   Family discord 02/14/2020   DYSPNEA 10/13/2009   ESSENTIAL HYPERTENSION, BENIGN 06/27/2009   Essential hypertension 06/16/2009   CHEST PAIN-UNSPECIFIED 06/16/2009    Patient's Medications  New Prescriptions   No medications on file  Previous Medications   AMLODIPINE (NORVASC) 10 MG TABLET    TAKE 1 TABLET BY MOUTH EVERY DAY **NEEDS OV**   ASCORBIC ACID (VITAMIN C PO)    Take 1 capsule by mouth daily at 12 noon.   BUSPIRONE (BUSPAR) 15 MG TABLET    Take 15 mg by mouth 2 (two) times daily.   CLOTRIMAZOLE-BETAMETHASONE (LOTRISONE) CREAM    Apply 1 Application topically in the morning and at bedtime.   DIVALPROEX (DEPAKOTE ER) 500 MG 24 HR TABLET    Take 500 mg by mouth 3 (three) times daily.   EMTRICITABINE-TENOFOVIR (TRUVADA) 200-300 MG TABLET    Take 1 tablet by mouth daily.   EMTRICITABINE-TENOFOVIR (TRUVADA) 200-300 MG TABLET    Take 1 tablet by mouth daily.   ESCITALOPRAM (LEXAPRO) 20 MG TABLET    Take 20 mg by mouth daily.   GABAPENTIN (NEURONTIN) 300 MG CAPSULE    Take by mouth.   LISINOPRIL-HYDROCHLOROTHIAZIDE (ZESTORETIC) 20-12.5 MG TABLET    Take 1 tablet by mouth daily at 12 noon.   LORAZEPAM (ATIVAN) 1 MG TABLET    Take 1 mg by mouth 2 (two) times daily as needed.   MELATONIN 10 MG TABS    Take 1 tablet by mouth daily as needed.  Modified Medications   No medications on file  Discontinued Medications   No medications on file    Allergies: No Known Allergies  Past Medical History: Past Medical History:  Diagnosis Date   ADHD    Anxiety    Depression    Family history of premature CAD    HTN (hypertension)     Social History: Social History   Socioeconomic History   Marital  status: Single    Spouse name: Not on file   Number of children: Not on file   Years of education: Not on file   Highest education level: Not on file  Occupational History   Occupation: Single  Tobacco Use   Smoking status: Never   Smokeless tobacco: Never  Vaping Use   Vaping status: Some Days   Substances: THC  Substance and Sexual Activity   Alcohol use: Yes    Comment: occ   Drug use: Yes    Types: Cocaine, Marijuana   Sexual activity: Yes    Birth control/protection: None  Other Topics Concern   Not on file  Social History Narrative   Not on file   Social Drivers of Health   Financial Resource Strain: Not on file  Food Insecurity: Not on file  Transportation Needs: Not on file  Physical Activity: Not on file  Stress: Not on file  Social Connections: Not on file       01/09/2023    1:29 PM  [facility] HIV PREP FLOWSHEET RESULTS  Insurance Status Insured  How did you hear? referred by PCP  Gender at birth Male  Gender identity cis-Male  Sex Partners Men only  Sex activity preferences Insertive;Oral  Treated for STI? No  HIV symptoms? None  PrEP Eligibility Yes    Labs:  SCr: Lab Results  Component Value Date   CREATININE 1.00 01/09/2023   CREATININE 1.0 06/27/2009   HIV Lab Results  Component Value Date   HIV NON-REACTIVE 05/05/2023   HIV NON-REACTIVE 01/09/2023   Hepatitis B No results found for: "HEPBSAB", "HEPBSAG", "HEPBCAB" Hepatitis C No results found for: "HEPCAB", "HCVRNAPCRQN" Hepatitis A No results found for: "HAV" RPR and STI Lab Results  Component Value Date   LABRPR NON-REACTIVE 05/05/2023   LABRPR NON-REACTIVE 01/09/2023    STI Results GC CT  01/09/2023  1:52 PM Negative  Negative   01/09/2023  1:16 PM Negative  Negative     Assessment: Jerry [name] presents to clinic today for PrEP follow-up. Has not filled Truvada since January 2025; ***. Has not been able to establish with a new PCP either. Remains covered through Medicaid and  would like to continue filling as a 90-day prescription.   Screened patient for acute HIV symptoms such as fatigue, muscle aches, rash, sore throat, lymphadenopathy, headache, night sweats, nausea/vomiting/diarrhea, and fever. Will check HIV antibody and send in refill once it results negative.  Last STI screening was in January and was negative. *** new sexual partners since last visit; will check *** today.  Plan: - Check HIV antibody - If HIV antibody, refill Truvada x 3 months - Follow-up with *** on ***   Margarite Gouge, PharmD, CPP, BCIDP, AAHIVP Clinical Pharmacist Practitioner Infectious Diseases Clinical Pharmacist [facility] 07/28/2023, 2:59 PM

## 2023-07-30 ENCOUNTER — Ambulatory Visit: Payer: Medicaid Other | Admitting: Pharmacist

## 2023-07-30 DIAGNOSIS — Z113 Encounter for screening for infections with a predominantly sexual mode of transmission: Secondary | ICD-10-CM

## 2023-07-30 DIAGNOSIS — Z79899 Other long term (current) drug therapy: Secondary | ICD-10-CM

## 2023-08-05 ENCOUNTER — Ambulatory Visit: Payer: Medicaid Other | Admitting: Pharmacist

## 2024-05-14 ENCOUNTER — Other Ambulatory Visit (HOSPITAL_COMMUNITY): Payer: Self-pay | Admitting: Family Medicine

## 2024-05-14 DIAGNOSIS — Z8249 Family history of ischemic heart disease and other diseases of the circulatory system: Secondary | ICD-10-CM

## 2024-05-28 ENCOUNTER — Other Ambulatory Visit (HOSPITAL_BASED_OUTPATIENT_CLINIC_OR_DEPARTMENT_OTHER)

## 2024-06-07 ENCOUNTER — Encounter (HOSPITAL_BASED_OUTPATIENT_CLINIC_OR_DEPARTMENT_OTHER): Payer: Self-pay

## 2024-06-07 ENCOUNTER — Other Ambulatory Visit (HOSPITAL_BASED_OUTPATIENT_CLINIC_OR_DEPARTMENT_OTHER)
